# Patient Record
Sex: Female | Born: 2000 | Race: White | Hispanic: No | Marital: Single | Smoking: Never smoker
Health system: Southern US, Community
[De-identification: ages and names within clinical notes are randomized; demographics above are authoritative.]

## PROBLEM LIST (undated history)

## (undated) DIAGNOSIS — Z789 Other specified health status: Secondary | ICD-10-CM

## (undated) DIAGNOSIS — Z87440 Personal history of urinary (tract) infections: Secondary | ICD-10-CM

## (undated) DIAGNOSIS — D649 Anemia, unspecified: Secondary | ICD-10-CM

## (undated) HISTORY — DX: Other specified health status: Z78.9

## (undated) HISTORY — DX: Personal history of urinary (tract) infections: Z87.440

## (undated) HISTORY — DX: Anemia, unspecified: D64.9

## (undated) HISTORY — PX: NO PAST SURGERIES: SHX2092

---
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
## 2020-01-07 ENCOUNTER — Ambulatory Visit (INDEPENDENT_AMBULATORY_CARE_PROVIDER_SITE_OTHER): Payer: Medicaid Other | Admitting: Certified Nurse Midwife

## 2020-01-07 ENCOUNTER — Ambulatory Visit (INDEPENDENT_AMBULATORY_CARE_PROVIDER_SITE_OTHER): Payer: Medicaid Other

## 2020-01-07 ENCOUNTER — Other Ambulatory Visit: Payer: Self-pay

## 2020-01-07 VITALS — BP 102/50 | Wt 141.0 lb

## 2020-01-07 DIAGNOSIS — Z3480 Encounter for supervision of other normal pregnancy, unspecified trimester: Secondary | ICD-10-CM

## 2020-01-07 DIAGNOSIS — Z3A20 20 weeks gestation of pregnancy: Secondary | ICD-10-CM

## 2020-01-07 DIAGNOSIS — Z3482 Encounter for supervision of other normal pregnancy, second trimester: Secondary | ICD-10-CM

## 2020-01-07 NOTE — Progress Notes (Signed)
ROB and anatomy scan at [facility]: Feeling fetal movement. Skin on hands cracked irritated from using soap and sanitizer. Has tried some lotions without relief. No hx of eczema. Recommend use of Eucerin CGA 20wk3d, anterior placenta, female gender, normal anatomy, FHR 159  Desires to breast feed. Unsuccessful breast feeding with first baby. Given RSB information ROB in 4 weeks Farrel Conners, CNM

## 2020-01-07 NOTE — Progress Notes (Signed)
ROB/anatomy  C/o hands have been breaking out and very sensitive Denies lof, no vb, some flutters

## 2020-02-04 ENCOUNTER — Encounter: Payer: Medicaid Other | Admitting: Advanced Practice Midwife

## 2020-02-21 ENCOUNTER — Ambulatory Visit (INDEPENDENT_AMBULATORY_CARE_PROVIDER_SITE_OTHER): Payer: Medicaid Other | Admitting: Certified Nurse Midwife

## 2020-02-21 ENCOUNTER — Other Ambulatory Visit: Payer: Self-pay

## 2020-02-21 VITALS — BP 102/62 | Wt 156.0 lb

## 2020-02-21 DIAGNOSIS — Z131 Encounter for screening for diabetes mellitus: Secondary | ICD-10-CM

## 2020-02-21 DIAGNOSIS — Z3A26 26 weeks gestation of pregnancy: Secondary | ICD-10-CM

## 2020-02-21 DIAGNOSIS — Z130 Encounter for screening for diseases of the blood and blood-forming organs and certain disorders involving the immune mechanism: Secondary | ICD-10-CM

## 2020-02-21 DIAGNOSIS — Z3482 Encounter for supervision of other normal pregnancy, second trimester: Secondary | ICD-10-CM

## 2020-02-21 DIAGNOSIS — Z113 Encounter for screening for infections with a predominantly sexual mode of transmission: Secondary | ICD-10-CM

## 2020-02-21 LAB — POCT URINALYSIS DIPSTICK OB
Glucose, UA: NEGATIVE
POC,PROTEIN,UA: NEGATIVE

## 2020-02-23 NOTE — Progress Notes (Signed)
ROB at 26wk6d: Doing well. Baby active.No vaginal bleeding, contractions. Exam: FH 29cm FHT 135 O POS A: IUP at 26wk6d P: ROB and 28 weeks labs in 2 weeks.  Farrel Conners, CNM

## 2020-03-06 ENCOUNTER — Ambulatory Visit (INDEPENDENT_AMBULATORY_CARE_PROVIDER_SITE_OTHER): Payer: Medicaid Other | Admitting: Advanced Practice Midwife

## 2020-03-06 ENCOUNTER — Other Ambulatory Visit: Payer: Medicaid Other

## 2020-03-06 ENCOUNTER — Other Ambulatory Visit: Payer: Self-pay

## 2020-03-06 ENCOUNTER — Encounter: Payer: Self-pay | Admitting: Advanced Practice Midwife

## 2020-03-06 VITALS — BP 110/60 | Wt 163.0 lb

## 2020-03-06 DIAGNOSIS — Z113 Encounter for screening for infections with a predominantly sexual mode of transmission: Secondary | ICD-10-CM

## 2020-03-06 DIAGNOSIS — Z130 Encounter for screening for diseases of the blood and blood-forming organs and certain disorders involving the immune mechanism: Secondary | ICD-10-CM

## 2020-03-06 DIAGNOSIS — Z131 Encounter for screening for diabetes mellitus: Secondary | ICD-10-CM

## 2020-03-06 DIAGNOSIS — Z3A28 28 weeks gestation of pregnancy: Secondary | ICD-10-CM

## 2020-03-06 DIAGNOSIS — Z3483 Encounter for supervision of other normal pregnancy, third trimester: Secondary | ICD-10-CM

## 2020-03-06 LAB — POCT URINALYSIS DIPSTICK OB
Glucose, UA: NEGATIVE
POC,PROTEIN,UA: NEGATIVE

## 2020-03-06 NOTE — Patient Instructions (Signed)
Third Trimester of Pregnancy The third trimester is from week 28 through week 40 (months 7 through 9). The third trimester is a time when the unborn baby (fetus) is growing rapidly. At the end of the ninth month, the fetus is about 20 inches in length and weighs 6-10 pounds. Body changes during your third trimester Your body will continue to go through many changes during pregnancy. The changes vary from woman to woman. During the third trimester:  Your weight will continue to increase. You can expect to gain 25-35 pounds (11-16 kg) by the end of the pregnancy.  You may begin to get stretch marks on your hips, abdomen, and breasts.  You may urinate more often because the fetus is moving lower into your pelvis and pressing on your bladder.  You may develop or continue to have heartburn. This is caused by increased hormones that slow down muscles in the digestive tract.  You may develop or continue to have constipation because increased hormones slow digestion and cause the muscles that push waste through your intestines to relax.  You may develop hemorrhoids. These are swollen veins (varicose veins) in the rectum that can itch or be painful.  You may develop swollen, bulging veins (varicose veins) in your legs.  You may have increased body aches in the pelvis, back, or thighs. This is due to weight gain and increased hormones that are relaxing your joints.  You may have changes in your hair. These can include thickening of your hair, rapid growth, and changes in texture. Some women also have hair loss during or after pregnancy, or hair that feels dry or thin. Your hair will most likely return to normal after your baby is born.  Your breasts will continue to grow and they will continue to become tender. A yellow fluid (colostrum) may leak from your breasts. This is the first milk you are producing for your baby.  Your belly button may stick out.  You may notice more swelling in your hands,  face, or ankles.  You may have increased tingling or numbness in your hands, arms, and legs. The skin on your belly may also feel numb.  You may feel short of breath because of your expanding uterus.  You may have more problems sleeping. This can be caused by the size of your belly, increased need to urinate, and an increase in your body's metabolism.  You may notice the fetus "dropping," or moving lower in your abdomen (lightening).  You may have increased vaginal discharge.  You may notice your joints feel loose and you may have pain around your pelvic bone. What to expect at prenatal visits You will have prenatal exams every 2 weeks until week 36. Then you will have weekly prenatal exams. During a routine prenatal visit:  You will be weighed to make sure you and the baby are growing normally.  Your blood pressure will be taken.  Your abdomen will be measured to track your baby's growth.  The fetal heartbeat will be listened to.  Any test results from the previous visit will be discussed.  You may have a cervical check near your due date to see if your cervix has softened or thinned (effaced).  You will be tested for Group B streptococcus. This happens between 35 and 37 weeks. Your health care provider may ask you:  What your birth plan is.  How you are feeling.  If you are feeling the baby move.  If you have had any abnormal   symptoms, such as leaking fluid, bleeding, severe headaches, or abdominal cramping.  If you are using any tobacco products, including cigarettes, chewing tobacco, and electronic cigarettes.  If you have any questions. Other tests or screenings that may be performed during your third trimester include:  Blood tests that check for low iron levels (anemia).  Fetal testing to check the health, activity level, and growth of the fetus. Testing is done if you have certain medical conditions or if there are problems during the pregnancy.  Nonstress test  (NST). This test checks the health of your baby to make sure there are no signs of problems, such as the baby not getting enough oxygen. During this test, a belt is placed around your belly. The baby is made to move, and its heart rate is monitored during movement. What is false labor? False labor is a condition in which you feel small, irregular tightenings of the muscles in the womb (contractions) that usually go away with rest, changing position, or drinking water. These are called Braxton Hicks contractions. Contractions may last for hours, days, or even weeks before true labor sets in. If contractions come at regular intervals, become more frequent, increase in intensity, or become painful, you should see your health care provider. What are the signs of labor?  Abdominal cramps.  Regular contractions that start at 10 minutes apart and become stronger and more frequent with time.  Contractions that start on the top of the uterus and spread down to the lower abdomen and back.  Increased pelvic pressure and dull back pain.  A watery or bloody mucus discharge that comes from the vagina.  Leaking of amniotic fluid. This is also known as your "water breaking." It could be a slow trickle or a gush. Let your health care provider know if it has a color or strange odor. If you have any of these signs, call your health care provider right away, even if it is before your due date. Follow these instructions at home: Medicines  Follow your health care provider's instructions regarding medicine use. Specific medicines may be either safe or unsafe to take during pregnancy.  Take a prenatal vitamin that contains at least 600 micrograms (mcg) of folic acid.  If you develop constipation, try taking a stool softener if your health care provider approves. Eating and drinking   Eat a balanced diet that includes fresh fruits and vegetables, whole grains, good sources of protein such as meat, eggs, or tofu,  and low-fat dairy. Your health care provider will help you determine the amount of weight gain that is right for you.  Avoid raw meat and uncooked cheese. These carry germs that can cause birth defects in the baby.  If you have low calcium intake from food, talk to your health care provider about whether you should take a daily calcium supplement.  Eat four or five small meals rather than three large meals a day.  Limit foods that are high in fat and processed sugars, such as fried and sweet foods.  To prevent constipation: ? Drink enough fluid to keep your urine clear or pale yellow. ? Eat foods that are high in fiber, such as fresh fruits and vegetables, whole grains, and beans. Activity  Exercise only as directed by your health care provider. Most women can continue their usual exercise routine during pregnancy. Try to exercise for 30 minutes at least 5 days a week. Stop exercising if you experience uterine contractions.  Avoid heavy lifting.  Do   not exercise in extreme heat or humidity, or at high altitudes.  Wear low-heel, comfortable shoes.  Practice good posture.  You may continue to have sex unless your health care provider tells you otherwise. Relieving pain and discomfort  Take frequent breaks and rest with your legs elevated if you have leg cramps or low back pain.  Take warm sitz baths to soothe any pain or discomfort caused by hemorrhoids. Use hemorrhoid cream if your health care provider approves.  Wear a good support bra to prevent discomfort from breast tenderness.  If you develop varicose veins: ? Wear support pantyhose or compression stockings as told by your healthcare provider. ? Elevate your feet for 15 minutes, 3-4 times a day. Prenatal care  Write down your questions. Take them to your prenatal visits.  Keep all your prenatal visits as told by your health care provider. This is important. Safety  Wear your seat belt at all times when driving.  Make  a list of emergency phone numbers, including numbers for family, friends, the hospital, and police and fire departments. General instructions  Avoid cat litter boxes and soil used by cats. These carry germs that can cause birth defects in the baby. If you have a cat, ask someone to clean the litter box for you.  Do not travel far distances unless it is absolutely necessary and only with the approval of your health care provider.  Do not use hot tubs, steam rooms, or saunas.  Do not drink alcohol.  Do not use any products that contain nicotine or tobacco, such as cigarettes and e-cigarettes. If you need help quitting, ask your health care provider.  Do not use any medicinal herbs or unprescribed drugs. These chemicals affect the formation and growth of the baby.  Do not douche or use tampons or scented sanitary pads.  Do not cross your legs for long periods of time.  To prepare for the arrival of your baby: ? Take prenatal classes to understand, practice, and ask questions about labor and delivery. ? Make a trial run to the hospital. ? Visit the hospital and tour the maternity area. ? Arrange for maternity or paternity leave through employers. ? Arrange for family and friends to take care of pets while you are in the hospital. ? Purchase a rear-facing car seat and make sure you know how to install it in your car. ? Pack your hospital bag. ? Prepare the baby's nursery. Make sure to remove all pillows and stuffed animals from the baby's crib to prevent suffocation.  Visit your dentist if you have not gone during your pregnancy. Use a soft toothbrush to brush your teeth and be gentle when you floss. Contact a health care provider if:  You are unsure if you are in labor or if your water has broken.  You become dizzy.  You have mild pelvic cramps, pelvic pressure, or nagging pain in your abdominal area.  You have lower back pain.  You have persistent nausea, vomiting, or  diarrhea.  You have an unusual or bad smelling vaginal discharge.  You have pain when you urinate. Get help right away if:  Your water breaks before 37 weeks.  You have regular contractions less than 5 minutes apart before 37 weeks.  You have a fever.  You are leaking fluid from your vagina.  You have spotting or bleeding from your vagina.  You have severe abdominal pain or cramping.  You have rapid weight loss or weight gain.  You have   shortness of breath with chest pain.  You notice sudden or extreme swelling of your face, hands, ankles, feet, or legs.  Your baby makes fewer than 10 movements in 2 hours.  You have severe headaches that do not go away when you take medicine.  You have vision changes. Summary  The third trimester is from week 28 through week 40, months 7 through 9. The third trimester is a time when the unborn baby (fetus) is growing rapidly.  During the third trimester, your discomfort may increase as you and your baby continue to gain weight. You may have abdominal, leg, and back pain, sleeping problems, and an increased need to urinate.  During the third trimester your breasts will keep growing and they will continue to become tender. A yellow fluid (colostrum) may leak from your breasts. This is the first milk you are producing for your baby.  False labor is a condition in which you feel small, irregular tightenings of the muscles in the womb (contractions) that eventually go away. These are called Braxton Hicks contractions. Contractions may last for hours, days, or even weeks before true labor sets in.  Signs of labor can include: abdominal cramps; regular contractions that start at 10 minutes apart and become stronger and more frequent with time; watery or bloody mucus discharge that comes from the vagina; increased pelvic pressure and dull back pain; and leaking of amniotic fluid. This information is not intended to replace advice given to you by your  health care provider. Make sure you discuss any questions you have with your health care provider. Document Revised: 01/31/2019 Document Reviewed: 11/15/2016 Elsevier Patient Education  2020 [employer]  

## 2020-03-06 NOTE — Progress Notes (Signed)
Routine Prenatal Care Visit  Subjective  Denise Hardin is a 19 y.o. G2P1001 at [redacted]w[redacted]d being seen today for ongoing prenatal care.  She is currently monitored for the following issues for this low-risk pregnancy and has Migraine; Supervision of other normal pregnancy, antepartum; Vaginal bleeding in pregnancy, first trimester; and Nausea/vomiting in pregnancy on their problem list.  ----------------------------------------------------------------------------------- Patient reports no complaints.   Contractions: Not present. Vag. Bleeding: None.  Movement: Present. Leaking Fluid denies.  ----------------------------------------------------------------------------------- The following portions of the patient's history were reviewed and updated as appropriate: allergies, current medications, past family history, past medical history, past social history, past surgical history and problem list. Problem list updated.  Objective  Blood pressure 110/60, weight 163 lb (73.9 kg), last menstrual period 08/17/2019 Pregravid weight 133 lb (60.3 kg) Total Weight Gain 30 lb (13.6 kg) Urinalysis: Urine Protein Negative  Urine Glucose Negative  Fetal Status: Fetal Heart Rate (bpm): 136 Fundal Height: 29 cm Movement: Present     General:  Alert, oriented and cooperative. Patient is in no acute distress.  Skin: Skin is warm and dry. No rash noted.   Cardiovascular: Normal heart rate noted  Respiratory: Normal respiratory effort, no problems with respiration noted  Abdomen: Soft, gravid, appropriate for gestational age. Pain/Pressure: Absent     Pelvic:  Cervical exam deferred        Extremities: Normal range of motion.     Mental Status: Normal mood and affect. Normal behavior. Normal judgment and thought content.   Assessment   19 y.o. G2P1001 at [redacted]w[redacted]d by  05/23/2020, by Last Menstrual Period presenting for routine prenatal visit  Plan   Pregnancy #2 Problems (from 08/17/19 to present)    Problem  Noted Resolved   Supervision of other normal pregnancy, antepartum 10/28/2019 by Conard Novak, MD No   Overview Addendum 01/07/2020  8:42 PM by Farrel Conners, CNM    Clinic [facility] Prenatal Labs  Dating LMP Blood type: O/Positive/-- (12/17 1501)   Genetic Screen 1 Screen:    AFP:     Quad:     NIPS: Antibody:Negative (12/17 1501)  Anatomic [place] Normal anatomy, anterior placenta, female gender Rubella: 1.02 (12/17 1501)  Varicella: Immune  GTT Early:                Third trimester:  RPR: Non Reactive (12/17 1501)   Rhogam  HBsAg: Negative (12/17 1501)   Vaccines TDAP:                       Flu Shot: HIV: Non Reactive (12/17 1501)   Baby Food                                GBS:   Contraception  Pap:  CBB     CS/VBAC    Support Person            Vaginal bleeding in pregnancy, first trimester 10/28/2019 by Conard Novak, MD No   Nausea/vomiting in pregnancy 10/28/2019 by Conard Novak, MD No    28 week labs today   Preterm labor symptoms and general obstetric precautions including but not limited to vaginal bleeding, contractions, leaking of fluid and fetal movement were reviewed in detail with the patient. Please refer to After Visit Summary for other counseling recommendations.   Return in about 2 weeks (around 03/20/2020) for rob.  Tresea Mall, CNM 03/06/2020 11:23 AM

## 2020-03-10 LAB — 28 WEEK RH+PANEL
Basophils Absolute: 0 10*3/uL (ref 0.0–0.2)
Basos: 0 %
EOS (ABSOLUTE): 0.1 10*3/uL (ref 0.0–0.4)
Eos: 1 %
Gestational Diabetes Screen: 100 mg/dL (ref 65–139)
HIV Screen 4th Generation wRfx: NONREACTIVE
Hematocrit: 33.1 % — ABNORMAL LOW (ref 34.0–46.6)
Hemoglobin: 10.9 g/dL — ABNORMAL LOW (ref 11.1–15.9)
Immature Grans (Abs): 0.2 10*3/uL — ABNORMAL HIGH (ref 0.0–0.1)
Immature Granulocytes: 2 %
Lymphocytes Absolute: 2 10*3/uL (ref 0.7–3.1)
Lymphs: 20 %
MCH: 29.3 pg (ref 26.6–33.0)
MCHC: 32.9 g/dL (ref 31.5–35.7)
MCV: 89 fL (ref 79–97)
Monocytes Absolute: 0.7 10*3/uL (ref 0.1–0.9)
Monocytes: 7 %
Neutrophils Absolute: 7.1 10*3/uL — ABNORMAL HIGH (ref 1.4–7.0)
Neutrophils: 70 %
Platelets: 248 10*3/uL (ref 150–450)
RBC: 3.72 x10E6/uL — ABNORMAL LOW (ref 3.77–5.28)
RDW: 11.4 % — ABNORMAL LOW (ref 11.7–15.4)
RPR Ser Ql: NONREACTIVE
WBC: 10.2 10*3/uL (ref 3.4–10.8)

## 2020-03-10 LAB — HEPATITIS PANEL, ACUTE
Hep A IgM: NEGATIVE
Hep B C IgM: NEGATIVE
Hep C Virus Ab: <0.1 s/co ratio (ref 0.0–0.9)
Hepatitis B Surface Ag: NEGATIVE

## 2020-03-20 ENCOUNTER — Ambulatory Visit (INDEPENDENT_AMBULATORY_CARE_PROVIDER_SITE_OTHER): Payer: Medicaid Other | Admitting: Advanced Practice Midwife

## 2020-03-20 ENCOUNTER — Encounter: Payer: Self-pay | Admitting: Advanced Practice Midwife

## 2020-03-20 ENCOUNTER — Other Ambulatory Visit: Payer: Self-pay

## 2020-03-20 VITALS — BP 90/64 | Wt 166.0 lb

## 2020-03-20 DIAGNOSIS — Z3A30 30 weeks gestation of pregnancy: Secondary | ICD-10-CM

## 2020-03-20 DIAGNOSIS — Z3483 Encounter for supervision of other normal pregnancy, third trimester: Secondary | ICD-10-CM

## 2020-03-20 LAB — POCT URINALYSIS DIPSTICK OB
Glucose, UA: NEGATIVE
POC,PROTEIN,UA: NEGATIVE

## 2020-03-20 NOTE — Patient Instructions (Signed)

## 2020-03-20 NOTE — Progress Notes (Signed)
Routine Prenatal Care Visit  Subjective  Denise Hardin is a 19 y.o. G2P1001 at [redacted]w[redacted]d being seen today for ongoing prenatal care.  She is currently monitored for the following issues for this low-risk pregnancy and has Migraine; Supervision of other normal pregnancy, antepartum; Vaginal bleeding in pregnancy, first trimester; and Nausea/vomiting in pregnancy on their problem list.  ----------------------------------------------------------------------------------- Patient reports no complaints.  We discussed TDAP- she will go to health department for vaccine. Contractions: Not present. Vag. Bleeding: None.  Movement: Present. Leaking Fluid denies.  ----------------------------------------------------------------------------------- The following portions of the patient's history were reviewed and updated as appropriate: allergies, current medications, past family history, past medical history, past social history, past surgical history and problem list. Problem list updated.  Objective  Blood pressure 90/64, weight 166 lb (75.3 kg), last menstrual period 08/17/2019, not currently breastfeeding. Pregravid weight 133 lb (60.3 kg) Total Weight Gain 33 lb (15 kg) Urinalysis: Urine Protein    Urine Glucose    Fetal Status: Fetal Heart Rate (bpm): 131 Fundal Height: 31 cm Movement: Present     General:  Alert, oriented and cooperative. Patient is in no acute distress.  Skin: Skin is warm and dry. No rash noted.   Cardiovascular: Normal heart rate noted  Respiratory: Normal respiratory effort, no problems with respiration noted  Abdomen: Soft, gravid, appropriate for gestational age. Pain/Pressure: Absent     Pelvic:  Cervical exam deferred        Extremities: Normal range of motion.  Edema: None  Mental Status: Normal mood and affect. Normal behavior. Normal judgment and thought content.   Assessment   19 y.o. G2P1001 at [redacted]w[redacted]d by  05/23/2020, by Last Menstrual Period presenting for routine  prenatal visit  Plan   Pregnancy #2 Problems (from 08/17/19 to present)    Problem Noted Resolved   Supervision of other normal pregnancy, antepartum 10/28/2019 by Will Bonnet, MD No   Overview Addendum 03/06/2020 11:25 AM by Rod Can, [place] Prenatal Labs  Dating LMP Blood type: O/Positive/-- (12/17 1501)   Genetic Screen 1 Screen:    AFP:     Quad:     NIPS: Antibody:Negative (12/17 1501)  Anatomic [place] Normal anatomy, anterior placenta, female gender Rubella: 1.02 (12/17 1501)  Varicella: Immune  GTT Early: NA               Third trimester:  RPR: Non Reactive (12/17 1501)   Rhogam  HBsAg: Negative (12/17 1501)   Vaccines TDAP:                       Flu Shot: HIV: Non Reactive (12/17 1501)   Baby Food                                GBS:   Contraception  Pap:  CBB     CS/VBAC    Support Person            Vaginal bleeding in pregnancy, first trimester 10/28/2019 by Will Bonnet, MD No   Nausea/vomiting in pregnancy 10/28/2019 by Will Bonnet, MD No       Preterm labor symptoms and general obstetric precautions including but not limited to vaginal bleeding, contractions, leaking of fluid and fetal movement were reviewed in detail with the patient. Please refer to After Visit Summary for other counseling recommendations.   Return in about 2 weeks (around  04/03/2020) for rob.  Tresea Mall, CNM 03/20/2020 1:55 PM

## 2020-03-20 NOTE — Progress Notes (Signed)
ROB/BT consent signed today(TDAP at [facility] <21)- no concerns

## 2020-03-20 NOTE — Addendum Note (Signed)
Addended by: Donnetta Hail on: 03/20/2020 02:00 PM   Modules accepted: Orders

## 2020-04-03 ENCOUNTER — Encounter: Payer: Self-pay | Admitting: Obstetrics & Gynecology

## 2020-04-03 ENCOUNTER — Ambulatory Visit (INDEPENDENT_AMBULATORY_CARE_PROVIDER_SITE_OTHER): Payer: Medicaid Other | Admitting: Obstetrics & Gynecology

## 2020-04-03 ENCOUNTER — Other Ambulatory Visit: Payer: Self-pay

## 2020-04-03 VITALS — BP 110/70 | Wt 168.0 lb

## 2020-04-03 DIAGNOSIS — Z3483 Encounter for supervision of other normal pregnancy, third trimester: Secondary | ICD-10-CM

## 2020-04-03 DIAGNOSIS — Z3A32 32 weeks gestation of pregnancy: Secondary | ICD-10-CM

## 2020-04-03 MED ORDER — FAMOTIDINE 20 MG PO TABS: 20.0000 mg | ORAL_TABLET | Freq: Two times a day (BID) | ORAL | 3 refills | Status: DC | PRN

## 2020-04-03 NOTE — Patient Instructions (Signed)
Braxton Hicks Contractions °Contractions of the uterus can occur throughout pregnancy, but they are not always a sign that you are in labor. You may have practice contractions called Braxton Hicks contractions. These false labor contractions are sometimes confused with true labor. °What are Braxton Hicks contractions? °Braxton Hicks contractions are tightening movements that occur in the muscles of the uterus before labor. Unlike true labor contractions, these contractions do not result in opening (dilation) and thinning of the cervix. Toward the end of pregnancy (32-34 weeks), Braxton Hicks contractions can happen more often and may become stronger. These contractions are sometimes difficult to tell apart from true labor because they can be very uncomfortable. You should not feel embarrassed if you go to the hospital with false labor. °Sometimes, the only way to tell if you are in true labor is for your health care provider to look for changes in the cervix. The health care provider will do a physical exam and may monitor your contractions. If you are not in true labor, the exam should show that your cervix is not dilating and your water has not broken. °If there are no other health problems associated with your pregnancy, it is completely safe for you to be sent home with false labor. You may continue to have Braxton Hicks contractions until you go into true labor. °How to tell the difference between true labor and false labor °True labor °· Contractions last 30-70 seconds. °· Contractions become very regular. °· Discomfort is usually felt in the top of the uterus, and it spreads to the lower abdomen and low back. °· Contractions do not go away with walking. °· Contractions usually become more intense and increase in frequency. °· The cervix dilates and gets thinner. °False labor °· Contractions are usually shorter and not as strong as true labor contractions. °· Contractions are usually irregular. °· Contractions  are often felt in the front of the lower abdomen and in the groin. °· Contractions may go away when you walk around or change positions while lying down. °· Contractions get weaker and are shorter-lasting as time goes on. °· The cervix usually does not dilate or become thin. °Follow these instructions at home: ° °· Take over-the-counter and prescription medicines only as told by your health care provider. °· Keep up with your usual exercises and follow other instructions from your health care provider. °· Eat and drink lightly if you think you are going into labor. °· If Braxton Hicks contractions are making you uncomfortable: °? Change your position from lying down or resting to walking, or change from walking to resting. °? Sit and rest in a tub of warm water. °? Drink enough fluid to keep your urine pale yellow. Dehydration may cause these contractions. °? Do slow and deep breathing several times an hour. °· Keep all follow-up prenatal visits as told by your health care provider. This is important. °Contact a health care provider if: °· You have a fever. °· You have continuous pain in your abdomen. °Get help right away if: °· Your contractions become stronger, more regular, and closer together. °· You have fluid leaking or gushing from your vagina. °· You pass blood-tinged mucus (bloody show). °· You have bleeding from your vagina. °· You have low back pain that you never had before. °· You feel your baby’s head pushing down and causing pelvic pressure. °· Your baby is not moving inside you as much as it used to. °Summary °· Contractions that occur before labor are   called Braxton Hicks contractions, false labor, or practice contractions. °· Braxton Hicks contractions are usually shorter, weaker, farther apart, and less regular than true labor contractions. True labor contractions usually become progressively stronger and regular, and they become more frequent. °· Manage discomfort from Braxton Hicks contractions  by changing position, resting in a warm bath, drinking plenty of water, or practicing deep breathing. °This information is not intended to replace advice given to you by your health care provider. Make sure you discuss any questions you have with your health care provider. °Document Revised: 09/22/2017 Document Reviewed: 02/23/2017 °Elsevier Patient Education © 2020 [employer] ° °

## 2020-04-03 NOTE — Progress Notes (Signed)
  Subjective  Fetal Movement? yes Contractions? no Leaking Fluid? no Vaginal Bleeding? no GERD Objective  BP 110/70   Wt 168 lb (76.2 kg)   LMP 08/17/2019 (Exact Date) Comment: Nexplanon removed early 06/2019  BMI 30.73 kg/m  General: NAD Pumonary: no increased work of breathing Abdomen: gravid, non-tender Extremities: no edema Psychiatric: mood appropriate, affect full  Assessment  19 y.o. G2P1001 at [redacted]w[redacted]d by  05/23/2020, by Last Menstrual Period presenting for routine prenatal visit  Plan   Problem List Items Addressed This Visit    None    Visit Diagnoses    [redacted] weeks gestation of pregnancy    -  Primary   Encounter for supervision of other normal pregnancy in third trimester          Pregnancy #2 Problems (from 08/17/19 to present)    Problem Noted Resolved   Supervision of other normal pregnancy, antepartum 10/28/2019 by Conard Novak, MD No   Overview Addendum 03/06/2020 11:25 AM by Tresea Mall, CNM    [facility]inic [facility] Prenatal Labs  Dating LMP Blood type: O/Positive/-- (12/17 1501)   Genetic Screen 1 Screen:    AFP:     Quad:     NIPS: Antibody:Negative (12/17 1501)  Anatomic [place] Normal anatomy, anterior placenta, female gender Rubella: 1.02 (12/17 1501)  Varicella: Immune  GTT Early: NA               Third trimester:  RPR: Non Reactive (12/17 1501)   Rhogam  HBsAg: Negative (12/17 1501)   Vaccines TDAP:                       Flu Shot: HIV: Non Reactive (12/17 1501)   Baby Food               Breast                 GBS: p  Contraception               POP Pap:  CBB  no   CS/VBAC n/a   Support Person            Previous Version   Vaginal bleeding in pregnancy, first trimester 10/28/2019 by Conard Novak, MD No   Nausea/vomiting in pregnancy 10/28/2019 by Conard Novak, MD No       [name], MD, [facility], [facility] 04/03/2020  3:07 PM

## 2020-04-17 ENCOUNTER — Encounter: Payer: Medicaid Other | Admitting: Advanced Practice Midwife

## 2020-04-23 ENCOUNTER — Ambulatory Visit (INDEPENDENT_AMBULATORY_CARE_PROVIDER_SITE_OTHER): Payer: Medicaid Other | Admitting: Obstetrics

## 2020-04-23 ENCOUNTER — Other Ambulatory Visit: Payer: Self-pay

## 2020-04-23 VITALS — BP 100/60 | Wt 177.0 lb

## 2020-04-23 DIAGNOSIS — Z3480 Encounter for supervision of other normal pregnancy, unspecified trimester: Secondary | ICD-10-CM

## 2020-04-23 DIAGNOSIS — Z3A35 35 weeks gestation of pregnancy: Secondary | ICD-10-CM

## 2020-04-23 DIAGNOSIS — K219 Gastro-esophageal reflux disease without esophagitis: Secondary | ICD-10-CM

## 2020-04-23 DIAGNOSIS — Z3483 Encounter for supervision of other normal pregnancy, third trimester: Secondary | ICD-10-CM

## 2020-04-23 LAB — POCT URINALYSIS DIPSTICK OB
Glucose, UA: NEGATIVE
POC,PROTEIN,UA: NEGATIVE

## 2020-04-23 MED ORDER — FAMOTIDINE 20 MG PO TABS: 20.0000 mg | ORAL_TABLET | Freq: Two times a day (BID) | ORAL | 3 refills | Status: DC | PRN

## 2020-04-23 NOTE — Progress Notes (Signed)
IUP at 60w5days with request for more GERD medicine. Denies any danger sxs. Ample fetal movement. O: See VE       TWG 44 lbs. A:   IUP35weeks5days       S=D      Reflux P: Will renew her reflux medication.      GBS culture retrieved       RTC in 1 week for weekly visits. Mirna Mires, CNM  04/23/2020 3:54 PM

## 2020-04-23 NOTE — Addendum Note (Signed)
Addended by: Mirna Mires on: 04/23/2020 04:08 PM   Modules accepted: Orders

## 2020-04-23 NOTE — Progress Notes (Signed)
ROB/GBS- no concerns 

## 2020-04-27 LAB — CULTURE, BETA STREP (GROUP B ONLY): Strep Gp B Culture: NEGATIVE

## 2020-05-01 ENCOUNTER — Ambulatory Visit (INDEPENDENT_AMBULATORY_CARE_PROVIDER_SITE_OTHER): Payer: Medicaid Other | Admitting: Obstetrics

## 2020-05-01 ENCOUNTER — Other Ambulatory Visit: Payer: Self-pay

## 2020-05-01 VITALS — BP 116/74 | Wt 178.0 lb

## 2020-05-01 DIAGNOSIS — Z3A36 36 weeks gestation of pregnancy: Secondary | ICD-10-CM

## 2020-05-01 DIAGNOSIS — Z3480 Encounter for supervision of other normal pregnancy, unspecified trimester: Secondary | ICD-10-CM

## 2020-05-01 NOTE — Progress Notes (Signed)
ROB

## 2020-05-01 NOTE — Progress Notes (Signed)
Routine Prenatal Care Visit  Subjective  Denise Hardin is a 19 y.o. G2P1001 at [redacted]w[redacted]d being seen today for ongoing prenatal care.  She is currently monitored for the following issues for this low-risk pregnancy and has Migraine; Supervision of other normal pregnancy, antepartum; Vaginal bleeding in pregnancy, first trimester; and Nausea/vomiting in pregnancy on their problem list.  ----------------------------------------------------------------------------------- Patient reports an episode of LOF the other night, that did not repeat. she did not put on a pad. she denies contractions or any additional LOF today..   Contractions: Not present. Vag. Bleeding: None.  Movement: Present. Leaking Fluid denies today. See note above.  ----------------------------------------------------------------------------------- The following portions of the patient's history were reviewed and updated as appropriate: allergies, current medications, past family history, past medical history, past social history, past surgical history and problem list. Problem list updated.  Objective  Blood pressure 116/74, weight 178 lb (80.7 kg), last menstrual period 08/17/2019. Pregravid weight 133 lb (60.3 kg) Total Weight Gain 45 lb (20.4 kg) Urinalysis: Urine Protein    Urine Glucose    Fetal Status: Fetal Heart Rate (bpm): 135 Fundal Height: 36 cm Movement: Present  Presentation: Vertex  General:  Alert, oriented and cooperative. Patient is in no acute distress.  Skin: Skin is warm and dry. No rash noted.   Cardiovascular: Normal heart rate noted  Respiratory: Normal respiratory effort, no problems with respiration noted  Abdomen: Soft, gravid, appropriate for gestational age. Pain/Pressure: Absent     Pelvic:  Cervical exam performed Dilation: 2 Effacement (%): 60 Station: -3  Extremities: Normal range of motion.  Edema: None  Mental Status: Normal mood and affect. Normal behavior. Normal judgment and thought content.    Fern test is NEGATIVE. Neg pooling Assessment   19 y.o. G2P1001 at [redacted]w[redacted]d by  05/23/2020, by Last Menstrual Period presenting for routine prenatal visit IBOW per testing today.  Plan   Pregnancy #2 Problems (from 08/17/19 to present)    Problem Noted Resolved   Supervision of other normal pregnancy, antepartum 10/28/2019 by Conard Novak, MD No   Overview Addendum 05/01/2020  2:01 PM by Mirna Mires, CNM    [facility]inic [facility] Prenatal Labs  Dating LMP Blood type: O/Positive/-- (12/17 1501)   Genetic Screen 1 Screen:    AFP:     Quad:     NIPS: Antibody:Negative (12/17 1501)  Anatomic [place] Normal anatomy, anterior placenta, female gender Rubella: 1.02 (12/17 1501)  Varicella: Immune  GTT Early: NA               Third trimester:  RPR: Non Reactive (12/17 1501)   Rhogam  HBsAg: Negative (12/17 1501)   Vaccines TDAP:       Given at [facility]                Flu Shot: HIV: Non Reactive (12/17 1501)   Baby Food                                GBS: 7/1 GBS  Contraception  Pap:  CBB     CS/VBAC    Support Person            Previous Version   Vaginal bleeding in pregnancy, first trimester 10/28/2019 by Conard Novak, MD No   Nausea/vomiting in pregnancy 10/28/2019 by Conard Novak, MD No       Term labor symptoms and general obstetric precautions including but not limited  to vaginal bleeding, contractions, leaking of fluid and fetal movement were reviewed in detail with the patient. Please refer to After Visit Summary for other counseling recommendations.   Return in about 1 week (around 05/08/2020) for return OB.  Mirna Mires, CNM  05/01/2020 2:23 PM

## 2020-05-08 ENCOUNTER — Ambulatory Visit (INDEPENDENT_AMBULATORY_CARE_PROVIDER_SITE_OTHER): Payer: Medicaid Other | Admitting: Obstetrics

## 2020-05-08 ENCOUNTER — Other Ambulatory Visit: Payer: Self-pay

## 2020-05-08 VITALS — BP 108/70 | Wt 182.0 lb

## 2020-05-08 DIAGNOSIS — Z3A37 37 weeks gestation of pregnancy: Secondary | ICD-10-CM

## 2020-05-08 DIAGNOSIS — Z3483 Encounter for supervision of other normal pregnancy, third trimester: Secondary | ICD-10-CM

## 2020-05-08 DIAGNOSIS — Z3480 Encounter for supervision of other normal pregnancy, unspecified trimester: Secondary | ICD-10-CM

## 2020-05-08 NOTE — Progress Notes (Signed)
Routine Prenatal Care Visit  Subjective  Denise Hardin is a 19 y.o. G2P1001 at [redacted]w[redacted]d being seen today for ongoing prenatal care.  She is currently monitored for the following issues for this low-risk pregnancy and has Migraine; Supervision of other normal pregnancy, antepartum; Vaginal bleeding in pregnancy, first trimester; and Nausea/vomiting in pregnancy on their problem list.  ----------------------------------------------------------------------------------- Patient reports no complaints. She would like to be checked today.   .  .   Pincus Large Fluid denies.  ----------------------------------------------------------------------------------- The following portions of the patient's history were reviewed and updated as appropriate: allergies, current medications, past family history, past medical history, past social history, past surgical history and problem list. Problem list updated.  Objective  Blood pressure 108/70, weight 182 lb (82.6 kg), last menstrual period 08/17/2019. Pregravid weight 133 lb (60.3 kg) Total Weight Gain 49 lb (22.2 kg) Urinalysis: Urine Protein    Urine Glucose    Fetal Status:           General:  Alert, oriented and cooperative. Patient is in no acute distress.  Skin: Skin is warm and dry. No rash noted.   Cardiovascular: Normal heart rate noted  Respiratory: Normal respiratory effort, no problems with respiration noted  Abdomen: Soft, gravid, appropriate for gestational age.       Pelvic:  Cervical exam performed        Extremities: Normal range of motion.     Mental Status: Normal mood and affect. Normal behavior. Normal judgment and thought content.   Assessment   19 y.o. G2P1001 at [redacted]w[redacted]d by  05/23/2020, by Last Menstrual Period presenting for routine prenatal visit  Plan   Pregnancy #2 Problems (from 08/17/19 to present)    Problem Noted Resolved   Supervision of other normal pregnancy, antepartum 10/28/2019 by Conard Novak, MD No   Overview  Addendum 05/08/2020  4:50 PM by Mirna Mires, CNM    [facility] Prenatal Labs  Dating LMP Blood type: O/Positive/-- (12/17 1501)   Genetic Screen 1 Screen:    AFP:     Quad:     NIPS: Antibody:Negative (12/17 1501)  Anatomic [place] Normal anatomy, anterior placenta, female gender Rubella: 1.02 (12/17 1501)  Varicella: Immune  GTT Early: NA               Third trimester:  RPR: Non Reactive (12/17 1501)   Rhogam  HBsAg: Negative (12/17 1501)   Vaccines TDAP:       Given at [facility]                Flu Shot: HIV: Non Reactive (12/17 1501)   Baby Food                                GBS: 7/1 negative  Contraception  Pap:  CBB     CS/VBAC    Support Person            Previous Version   Vaginal bleeding in pregnancy, first trimester 10/28/2019 by Conard Novak, MD No   Nausea/vomiting in pregnancy 10/28/2019 by Conard Novak, MD No       Term labor symptoms and general obstetric precautions including but not limited to vaginal bleeding, contractions, leaking of fluid and fetal movement were reviewed in detail with the patient. Please refer to After Visit Summary for other counseling recommendations.   Return in about 2 weeks (around 05/22/2020) for return OB.  Mirna Mires, CNM  05/08/2020 5:05 PM

## 2020-05-08 NOTE — Progress Notes (Signed)
ROB- cervix check 

## 2020-05-15 ENCOUNTER — Encounter: Payer: Self-pay | Admitting: Advanced Practice Midwife

## 2020-05-15 ENCOUNTER — Other Ambulatory Visit: Payer: Self-pay

## 2020-05-15 ENCOUNTER — Ambulatory Visit (INDEPENDENT_AMBULATORY_CARE_PROVIDER_SITE_OTHER): Payer: Medicaid Other | Admitting: Advanced Practice Midwife

## 2020-05-15 VITALS — BP 112/72 | Ht 62.0 in | Wt 183.0 lb

## 2020-05-15 DIAGNOSIS — Z3480 Encounter for supervision of other normal pregnancy, unspecified trimester: Secondary | ICD-10-CM

## 2020-05-15 DIAGNOSIS — Z3483 Encounter for supervision of other normal pregnancy, third trimester: Secondary | ICD-10-CM

## 2020-05-15 DIAGNOSIS — Z3A38 38 weeks gestation of pregnancy: Secondary | ICD-10-CM

## 2020-05-15 LAB — POCT URINALYSIS DIPSTICK OB
Glucose, UA: NEGATIVE
POC,PROTEIN,UA: NEGATIVE

## 2020-05-15 NOTE — Progress Notes (Signed)
Routine Prenatal Care Visit  Subjective  Denise Hardin is a 20 y.o. G2P1001 at [redacted]w[redacted]d being seen today for ongoing prenatal care.  She is currently monitored for the following issues for this low-risk pregnancy and has Migraine; Supervision of other normal pregnancy, antepartum; Vaginal bleeding in pregnancy, first trimester; and Nausea/vomiting in pregnancy on their problem list.  ----------------------------------------------------------------------------------- Patient reports no complaints.   Contractions: Not present. Vag. Bleeding: None.  Movement: Present. Leaking Fluid denies.  ----------------------------------------------------------------------------------- The following portions of the patient's history were reviewed and updated as appropriate: allergies, current medications, past family history, past medical history, past social history, past surgical history and problem list. Problem list updated.  Objective  Blood pressure 112/72, height 5\' 2"  (1.575 m), weight 183 lb (83 kg), last menstrual period 08/17/2019. Pregravid weight 133 lb (60.3 kg) Total Weight Gain 50 lb (22.7 kg) Urinalysis: Urine Protein Negative  Urine Glucose Negative  Fetal Status: Fetal Heart Rate (bpm): 139 Fundal Height: 38 cm Movement: Present     General:  Alert, oriented and cooperative. Patient is in no acute distress.  Skin: Skin is warm and dry. No rash noted.   Cardiovascular: Normal heart rate noted  Respiratory: Normal respiratory effort, no problems with respiration noted  Abdomen: Soft, gravid, appropriate for gestational age. Pain/Pressure: Absent     Pelvic:  Cervical exam deferred        Extremities: Normal range of motion.  Edema: None  Mental Status: Normal mood and affect. Normal behavior. Normal judgment and thought content.   Assessment   19 y.o. G2P1001 at [redacted]w[redacted]d by  05/23/2020, by Last Menstrual Period presenting for routine prenatal visit  Plan   Pregnancy #2 Problems (from  08/17/19 to present)    Problem Noted Resolved   Supervision of other normal pregnancy, antepartum 10/28/2019 by 12/26/2019, MD No   Overview Addendum 05/08/2020  5:08 PM by 05/10/2020, CNM    [facility] Prenatal Labs  Dating LMP Blood type: O/Positive/-- (12/17 1501)   Genetic Screen 1 Screen:    AFP:     Quad:     NIPS: Antibody:Negative (12/17 1501)  Anatomic 10-15-1982 Normal anatomy, anterior placenta, female gender Rubella: 1.02 (12/17 1501)  Varicella: Immune  GTT Early: NA               Third trimester:  RPR: Non Reactive (12/17 1501)   Rhogam  HBsAg: Negative (12/17 1501)   Vaccines TDAP:       Given at [facility]                Flu Shot: HIV: Non Reactive (12/17 1501)   Baby Food                Breast                GBS: 7/1 negative  Contraception  Pap:  CBB     CS/VBAC    Support Person  FOB           Previous Version   Vaginal bleeding in pregnancy, first trimester 10/28/2019 by 12/26/2019, MD No   Nausea/vomiting in pregnancy 10/28/2019 by 12/26/2019, MD No       Term labor symptoms and general obstetric precautions including but not limited to vaginal bleeding, contractions, leaking of fluid and fetal movement were reviewed in detail with the patient.   Return in about 1 week (around 05/22/2020) for rob.  05/24/2020, CNM 05/15/2020 4:59 PM

## 2020-05-22 ENCOUNTER — Ambulatory Visit (INDEPENDENT_AMBULATORY_CARE_PROVIDER_SITE_OTHER): Payer: Medicaid Other | Admitting: Certified Nurse Midwife

## 2020-05-22 ENCOUNTER — Other Ambulatory Visit: Payer: Self-pay

## 2020-05-22 VITALS — BP 108/60 | Wt 183.0 lb

## 2020-05-22 DIAGNOSIS — Z3A39 39 weeks gestation of pregnancy: Secondary | ICD-10-CM

## 2020-05-22 DIAGNOSIS — Z3483 Encounter for supervision of other normal pregnancy, third trimester: Secondary | ICD-10-CM

## 2020-05-22 DIAGNOSIS — Z3480 Encounter for supervision of other normal pregnancy, unspecified trimester: Secondary | ICD-10-CM

## 2020-05-22 LAB — POCT URINALYSIS DIPSTICK OB
Glucose, UA: NEGATIVE
POC,PROTEIN,UA: NEGATIVE

## 2020-05-22 NOTE — Progress Notes (Signed)
No concerns.rj 

## 2020-05-24 NOTE — Progress Notes (Signed)
ROB at 39wk6d: Doing well. Denies regular contractions, leakage of fluid or vaginal bleeding. Baby moving well.  Exam: FHTs WNL. BP 108/60. Negative proteinuria. TWG at 50# (weight 183#) Cervix:   A: IUP at 39wk6d  P: [place] precautions IOL scheduled for 41.1 weeks (8 August) Covid testing 6 August  Farrel Conners, [place]

## 2020-05-29 ENCOUNTER — Encounter: Payer: Medicaid Other | Admitting: Obstetrics & Gynecology

## 2020-07-06 ENCOUNTER — Ambulatory Visit: Payer: Medicaid Other | Admitting: Obstetrics and Gynecology

## 2020-07-31 ENCOUNTER — Ambulatory Visit: Payer: Medicaid Other | Admitting: Obstetrics and Gynecology

## 2020-08-15 ENCOUNTER — Other Ambulatory Visit: Payer: Self-pay

## 2020-08-15 ENCOUNTER — Encounter (HOSPITAL_COMMUNITY): Payer: Self-pay | Admitting: Emergency Medicine

## 2020-08-15 ENCOUNTER — Emergency Department (HOSPITAL_COMMUNITY)
Admission: EM | Admit: 2020-08-15 | Discharge: 2020-08-15 | Disposition: A | Discharge status: Home / Self Care | Payer: Medicaid Other | Attending: Emergency Medicine | Admitting: Emergency Medicine

## 2020-08-15 DIAGNOSIS — R109 Unspecified abdominal pain: Secondary | ICD-10-CM

## 2020-08-15 DIAGNOSIS — R42 Dizziness and giddiness: Secondary | ICD-10-CM | POA: Diagnosis not present

## 2020-08-15 LAB — URINALYSIS, ROUTINE W REFLEX MICROSCOPIC
Bilirubin Urine: NEGATIVE
Glucose, UA: NEGATIVE mg/dL
Hgb urine dipstick: NEGATIVE
Ketones, ur: NEGATIVE mg/dL
Nitrite: NEGATIVE
Protein, ur: NEGATIVE mg/dL
Specific Gravity, Urine: 1.024 (ref 1.005–1.030)
pH: 8 (ref 5.0–8.0)

## 2020-08-15 LAB — CBC
HCT: 40.4 % (ref 36.0–46.0)
Hemoglobin: 12.7 g/dL (ref 12.0–15.0)
MCH: 25.8 pg — ABNORMAL LOW (ref 26.0–34.0)
MCHC: 31.4 g/dL (ref 30.0–36.0)
MCV: 82.1 fL (ref 80.0–100.0)
Platelets: 318 10*3/uL (ref 150–400)
RBC: 4.92 MIL/uL (ref 3.87–5.11)
RDW: 13.9 % (ref 11.5–15.5)
WBC: 9.1 10*3/uL (ref 4.0–10.5)
nRBC: 0 % (ref 0.0–0.2)

## 2020-08-15 LAB — COMPREHENSIVE METABOLIC PANEL
ALT: 18 U/L (ref 0–44)
AST: 21 U/L (ref 15–41)
Albumin: 4.7 g/dL (ref 3.5–5.0)
Alkaline Phosphatase: 76 U/L (ref 38–126)
Anion gap: 9 (ref 5–15)
BUN: 17 mg/dL (ref 6–20)
CO2: 26 mmol/L (ref 22–32)
Calcium: 9.3 mg/dL (ref 8.9–10.3)
Chloride: 105 mmol/L (ref 98–111)
Creatinine, Ser: 0.9 mg/dL (ref 0.44–1.00)
GFR, Estimated: >60 mL/min (ref >60)
Glucose, Bld: 127 mg/dL — ABNORMAL HIGH (ref 70–99)
Potassium: 3.5 mmol/L (ref 3.5–5.1)
Sodium: 140 mmol/L (ref 135–145)
Total Bilirubin: 0.9 mg/dL (ref 0.3–1.2)
Total Protein: 7.8 g/dL (ref 6.5–8.1)

## 2020-08-15 LAB — LIPASE, BLOOD: Lipase: 24 U/L (ref 11–51)

## 2020-08-15 LAB — HCG, QUANTITATIVE, PREGNANCY: hCG, Beta Chain, Quant, S: <1 m[IU]/mL (ref <5)

## 2020-08-15 MED ORDER — SODIUM CHLORIDE 0.9 % IV BOLUS
1000.0000 mL | Freq: Once | INTRAVENOUS | Status: AC
  Administered 2020-08-15: 1000 mL via INTRAVENOUS

## 2020-08-15 MED ORDER — KETOROLAC TROMETHAMINE 15 MG/ML IJ SOLN
15.0000 mg | Freq: Once | INTRAMUSCULAR | Status: AC
  Administered 2020-08-15: 15 mg via INTRAVENOUS
  Filled 2020-08-15: qty 1

## 2020-08-15 NOTE — ED Notes (Signed)
PT reports that she she "faints easily" and felt like she may faint pta.  VSS.  Will continue to monitor.

## 2020-08-15 NOTE — Discharge Instructions (Addendum)
Your evaluation in the emergency department today has been reassuring.  We recommend follow-up with your OB/GYN for postpartum care.  Take Tylenol or ibuprofen for management of persistent pain.  Use as instructed on the box/bottle.  Return for new or concerning symptoms.

## 2020-08-15 NOTE — ED Triage Notes (Addendum)
Patient states for the pass three days she has been feeling faint. Patient is having abdominal pain and her mouth is dry. Patient last menstrual cycle 07/11/2020.

## 2020-08-15 NOTE — ED Provider Notes (Addendum)
[facility]-EMERGENCY DEPT Provider Note   CSN: [id number] Arrival date & time: 08/15/20  0114     History Chief Complaint  Patient presents with  . Abdominal Pain    Denise Hardin is a 19 y.o. female.  19 year old female presents to the emergency department for evaluation of abdominal cramping.  She states that symptoms began tonight and have been waxing and waning in severity.  Pain is nonradiating without known modifying factors.  She has not taken any medications for her discomfort.  States that she began to feel lightheaded after her pain began.  Has been experiencing intermittent white spots in her vision and the sensation of tunnel vision.  She has not had any syncope since onset of her lightheadedness and abdominal pain.  Lightheadedness is improved by holding a wet rag.  She reports getting lightheaded on occasion quite frequently and this usually helps her symptoms subside.  Denies fever, sick contacts, nausea, vomiting, diarrhea, dysuria, hematuria, vaginal bleeding, vaginal discharge, melena, hematochezia.  LMP 07/11/20.  She reports being 3 months postpartum after delivery via C-section without complications.  Has not yet followed up with her OB/GYN for reassessment.  States that she has good social support at home with her infant including her husband, parents, and grandparents.  The history is provided by the patient. No language interpreter was used.  Abdominal Pain      Past Medical History:  Diagnosis Date  . Anemia   . History of urinary tract infection   . No known health problems     Patient Active Problem List   Diagnosis Date Noted  . Supervision of other normal pregnancy, antepartum 10/28/2019  . Vaginal bleeding in pregnancy, first trimester 10/28/2019  . Nausea/vomiting in pregnancy 10/28/2019  . Migraine 10/05/2017    Past Surgical History:  Procedure Laterality Date  . NO PAST SURGERIES       OB History    Gravida  2    Para  2   Term  2   Preterm      AB      Living  2     SAB      TAB      Ectopic      Multiple  0   Live Births  2           Family History  Problem Relation Age of Onset  . Diabetes Maternal Grandmother   . Ovarian cancer Paternal Grandmother     Social History   Tobacco Use  . Smoking status: Never Smoker  . Smokeless tobacco: Never Used  Vaping Use  . Vaping Use: Former  . Quit date: 08/24/2019  Substance Use Topics  . Alcohol use: Not Currently    Comment: Last ETOH use 4 months ago.  . Drug use: Not Currently    Types: Marijuana    Comment: Last marijuana use one month ago.    Home Medications Prior to Admission medications   Medication Sig Start Date End Date Taking? Authorizing Provider  docusate sodium (COLACE) 100 MG capsule Take 1 capsule (100 mg total) by mouth 2 (two) times daily as needed. Patient not taking: Reported on 08/15/2020 10/10/19   Natale Milch, MD  famotidine (PEPCID) 20 MG tablet Take 1 tablet (20 mg total) by mouth 2 (two) times daily as needed for heartburn or indigestion. Patient not taking: Reported on 08/15/2020 04/23/20   Mirna Mires, CNM  Prenatal Vit-Fe Fumarate-FA (PRENATAL VITAMIN) 27-0.8 MG TABS  Take 1 tablet by mouth daily at 6 (six) AM. Patient not taking: Reported on 08/15/2020 09/24/19   Federico Flake, MD    Allergies    Citrus  Review of Systems   Review of Systems  Gastrointestinal: Positive for abdominal pain.  Ten systems reviewed and are negative for acute change, except as noted in the HPI.    Physical Exam Updated Vital Signs BP 110/76   Pulse (!) 54   Temp 98 F (36.7 C) (Oral)   Resp 18   Ht 5\' 2"  (1.575 m)   Wt 73.5 kg   LMP 07/11/2020   SpO2 100%   BMI 29.65 kg/m   Physical Exam Vitals and nursing note reviewed.  Constitutional:      General: She is not in acute distress.    Appearance: She is well-developed. She is not diaphoretic.  HENT:     Head:  Normocephalic and atraumatic.  Eyes:     General: No scleral icterus.    Conjunctiva/sclera: Conjunctivae normal.  Cardiovascular:     Rate and Rhythm: Normal rate and regular rhythm.     Pulses: Normal pulses.  Pulmonary:     Effort: Pulmonary effort is normal. No respiratory distress.     Comments: Respirations even and unlabored Abdominal:     Palpations: Abdomen is soft. There is no mass.     Tenderness: There is abdominal tenderness (mild to LLQ, suprapubic abdomen). There is no guarding.     Comments: No peritoneal signs.  Low-lying C-section scar appears well-healed without associated erythema, induration, dehiscence.  Musculoskeletal:        General: Normal range of motion.     Cervical back: Normal range of motion.  Skin:    General: Skin is warm and dry.     Coloration: Skin is not pale.     Findings: No erythema or rash.  Neurological:     Mental Status: She is alert and oriented to person, place, and time.     Coordination: Coordination normal.  Psychiatric:        Mood and Affect: Mood is anxious.        Speech: Speech normal.        Behavior: Behavior normal.     ED Results / Procedures / Treatments   Labs (all labs ordered are listed, but only abnormal results are displayed) Labs Reviewed  COMPREHENSIVE METABOLIC PANEL - Abnormal; Notable for the following components:      Result Value   Glucose, Bld 127 (*)    All other components within normal limits  CBC - Abnormal; Notable for the following components:   MCH 25.8 (*)    All other components within normal limits  URINALYSIS, ROUTINE W REFLEX MICROSCOPIC - Abnormal; Notable for the following components:   APPearance CLOUDY (*)    Leukocytes,Ua TRACE (*)    Bacteria, UA RARE (*)    All other components within normal limits  LIPASE, BLOOD  HCG, QUANTITATIVE, PREGNANCY    EKG ED ECG REPORT   Date: 08/15/2020  Rate: 63  Rhythm: normal sinus rhythm  QRS Axis: normal  Intervals: normal  ST/T Wave  abnormalities: normal  Conduction Disutrbances:none  Narrative Interpretation: Normal sinus rhythm  Old EKG Reviewed: none available  I have personally reviewed the EKG tracing and agree with the computerized printout as noted.   Radiology No results found.  Procedures Procedures (including critical care time)  Medications Ordered in ED Medications  ketorolac (TORADOL) 15 MG/ML injection 15  mg (15 mg Intravenous Given 08/15/20 0252)  sodium chloride 0.9 % bolus 1,000 mL (0 mLs Intravenous Stopped 08/15/20 0443)    ED Course  I have reviewed the triage vital signs and the nursing notes.  Pertinent labs & imaging results that were available during my care of the patient were reviewed by me and considered in my medical decision making (see chart for details).  Clinical Course as of Aug 15 613  Sat Aug 15, 2020  4034 Patient states that pain is improved.  She is no longer feeling lightheaded.  Repeat abdominal exam improved.  States that she is feeling little abdominal pain presently.  Do not feel emergent imaging is presently indicated.  She may be experiencing some scar tissue associated with her C-section from 3 months ago.  Patient expresses comfort with outpatient management and the use of Tylenol/ibuprofen for pain.  Return precautions discussed.   [KH]    Clinical Course User Index [KH] Antony Madura, PA-C   MDM Rules/Calculators/A&P                          19 year old female presenting to the ED for nonspecific abdominal cramping.  This has been improving since arrival after receiving Toradol and IV fluids.  While she has some lower abdominal tenderness, her exam is fairly benign.  No peritoneal signs.  Repeat abdominal exam improved on repeat assessment.  No fever or leukocytosis to suggest infectious etiology.  Urinalysis negative for UTI.  No significant electrolyte derangements.  Liver and kidney function preserved.    Was feeling lightheaded around the time of her  abdominal pain onset, but endorses feeling lightheaded fairly frequently.  She is not orthostatic and has not had any syncopal episode.  EKG shows NSR.  Question whether abdominal cramping may be related to scar tissue associated with recent cesarean section.  There is no evidence of secondary infection/cellulitis at site of C-section scar.  In light of reassuring labs, improving exam, I feel it is appropriate for the patient to follow-up with her primary care doctor for repeat assessment.  Advised use of Tylenol or ibuprofen for pain control.  Return precautions discussed and provided. Patient discharged in stable condition with no unaddressed concerns.   Final Clinical Impression(s) / ED Diagnoses Final diagnoses:  Abdominal pain, unspecified abdominal location  Lightheadedness    Rx / DC Orders ED Discharge Orders    None       Antony Madura, PA-C 08/15/20 0618    Antony Madura, PA-C 08/15/20 7425    Rolan Bucco, MD 08/15/20 [phone]

## 2020-08-15 NOTE — ED Notes (Signed)
Assumed care of patient at this time, nad noted, sr up x2, bed locked and low, call bell w/I reach.  Will continue to monitor. ° °

## 2020-08-17 ENCOUNTER — Telehealth: Payer: Self-pay | Admitting: Obstetrics and Gynecology

## 2020-08-17 NOTE — Telephone Encounter (Signed)
Patient is scheduled for 09/03/20 at 10:50 in [place] for nexplanon placement

## 2020-08-18 ENCOUNTER — Ambulatory Visit: Payer: Medicaid Other

## 2020-08-18 NOTE — Telephone Encounter (Signed)
Noted. Will order to arrive by apt date/time. 

## 2020-08-21 ENCOUNTER — Ambulatory Visit: Payer: Medicaid Other

## 2020-08-21 ENCOUNTER — Other Ambulatory Visit: Payer: Self-pay

## 2020-09-03 ENCOUNTER — Ambulatory Visit: Payer: Medicaid Other | Admitting: Obstetrics and Gynecology

## 2020-09-03 NOTE — Telephone Encounter (Signed)
Patient is reschedule to 09/24/20 at 10 am with AMS in [facility]

## 2020-09-13 ENCOUNTER — Emergency Department (HOSPITAL_COMMUNITY): Payer: Medicaid Other

## 2020-09-13 ENCOUNTER — Emergency Department (HOSPITAL_COMMUNITY)
Admission: EM | Admit: 2020-09-13 | Discharge: 2020-09-13 | Disposition: A | Discharge status: Home / Self Care | Payer: Medicaid Other | Attending: Emergency Medicine | Admitting: Emergency Medicine

## 2020-09-13 ENCOUNTER — Other Ambulatory Visit: Payer: Self-pay

## 2020-09-13 ENCOUNTER — Encounter (HOSPITAL_COMMUNITY): Payer: Self-pay | Admitting: *Deleted

## 2020-09-13 DIAGNOSIS — R110 Nausea: Secondary | ICD-10-CM | POA: Insufficient documentation

## 2020-09-13 DIAGNOSIS — S0990XA Unspecified injury of head, initial encounter: Secondary | ICD-10-CM | POA: Diagnosis present

## 2020-09-13 DIAGNOSIS — W52XXXA Crushed, pushed or stepped on by crowd or human stampede, initial encounter: Secondary | ICD-10-CM | POA: Insufficient documentation

## 2020-09-13 LAB — I-STAT BETA HCG BLOOD, ED (MC, WL, AP ONLY): I-stat hCG, quantitative: <5 m[IU]/mL (ref <5)

## 2020-09-13 MED ORDER — ACETAMINOPHEN 325 MG PO TABS
650.0000 mg | ORAL_TABLET | Freq: Once | ORAL | Status: AC
  Administered 2020-09-13: 650 mg via ORAL
  Filled 2020-09-13: qty 2

## 2020-09-13 MED ORDER — ONDANSETRON 4 MG PO TBDP
8.0000 mg | ORAL_TABLET | Freq: Once | ORAL | Status: AC
  Administered 2020-09-13: 8 mg via ORAL
  Filled 2020-09-13: qty 2

## 2020-09-13 MED ORDER — IBUPROFEN 800 MG PO TABS: 800.0000 mg | ORAL_TABLET | Freq: Three times a day (TID) | ORAL | 0 refills | Status: DC

## 2020-09-13 NOTE — ED Notes (Signed)
Per provider pt PO tested Able to drink water and eat crackers without increased abdominal pain and no vomiting.
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
## 2020-09-24 ENCOUNTER — Ambulatory Visit: Payer: Medicaid Other | Admitting: Obstetrics and Gynecology

## 2021-02-26 ENCOUNTER — Encounter: Payer: Self-pay | Admitting: Advanced Practice Midwife

## 2021-02-26 ENCOUNTER — Other Ambulatory Visit: Payer: Self-pay

## 2021-02-26 ENCOUNTER — Other Ambulatory Visit (HOSPITAL_COMMUNITY)
Admission: RE | Admit: 2021-02-26 | Discharge: 2021-02-26 | Disposition: A | Discharge status: Home / Self Care | Payer: Medicaid Other | Source: Ambulatory Visit | Attending: Advanced Practice Midwife | Admitting: Advanced Practice Midwife

## 2021-02-26 ENCOUNTER — Ambulatory Visit (INDEPENDENT_AMBULATORY_CARE_PROVIDER_SITE_OTHER): Payer: Medicaid Other | Admitting: Advanced Practice Midwife

## 2021-02-26 VITALS — BP 92/60 | Ht 62.0 in | Wt 133.0 lb

## 2021-02-26 DIAGNOSIS — N898 Other specified noninflammatory disorders of vagina: Secondary | ICD-10-CM

## 2021-02-26 MED ORDER — METRONIDAZOLE 0.75 % VA GEL: 1.0000 | Freq: Every day | VAGINAL | 1 refills | Status: AC

## 2021-02-28 ENCOUNTER — Encounter: Payer: Self-pay | Admitting: Advanced Practice Midwife

## 2021-02-28 NOTE — Progress Notes (Addendum)
Patient ID: Denise Hardin, female   DOB: Nov 10, 2000, 20 y.o.   MRN: [id number]  Reason for Consult: Vaginal Discharge (Itching, irritation, no odor x 2 days)    Subjective:  Date of Service: 02/26/2021  HPI:  Denise Hardin is a 20 y.o. female being seen for symptoms of vaginitis. She has also recently taken a positive pregnancy test. She is scheduled for a NOB visit next week. Her symptoms for the past 2 days are itching, irritation, thick white discharge. She denies odor or urinary symptoms. She did try Monistat yesterday and says it caused an increase in irritation. She denies a history of yeast infections. She has had a UTI in the past. She describes her symptoms as moderately uncomfortable and requests medication to treat. We discussed starting with metronidazole gel (avoid PO in early pregnancy) in case of BV. Rx's for vaginal yeast treatment are not covered by her insurance. Message sent to patient recommending clotrimazole OTC vaginal cream instead of monistat. Will also follow up after lab results. She accepts STD testing as well as it will be done for her pregnancy.  Past Medical History:  Diagnosis Date  . Anemia   . History of urinary tract infection   . No known health problems    Family History  Problem Relation Age of Onset  . Diabetes Maternal Grandmother   . Ovarian cancer Paternal Grandmother        40s/50s   Past Surgical History:  Procedure Laterality Date  . NO PAST SURGERIES      Short Social History:  Social History   Tobacco Use  . Smoking status: Never Smoker  . Smokeless tobacco: Never Used  Substance Use Topics  . Alcohol use: Not Currently    Comment: Last ETOH use 4 months ago.    Allergies  Allergen Reactions  . Citrus Rash    Skin irritation from citrus peels.    Current Outpatient Medications  Medication Sig Dispense Refill  . metroNIDAZOLE (METROGEL) 0.75 % vaginal gel Place 1 Applicatorful vaginally at bedtime for 5 days. 70 g 1    No current facility-administered medications for this visit.    Review of Systems  Constitutional: Positive for malaise/fatigue. Negative for chills and fever.  HENT: Negative for congestion, ear discharge, ear pain, hearing loss, sinus pain and sore throat.   Eyes: Negative for blurred vision and double vision.  Respiratory: Negative for cough, shortness of breath and wheezing.   Cardiovascular: Negative for chest pain, palpitations and leg swelling.  Gastrointestinal: Negative for abdominal pain, blood in stool, constipation, diarrhea, heartburn, melena, nausea and vomiting.  Genitourinary: Negative for dysuria, flank pain, frequency, hematuria and urgency.       Positive for vaginal irritation, itching, discharge  Musculoskeletal: Negative for back pain, joint pain and myalgias.  Skin: Negative for itching and rash.  Neurological: Negative for dizziness, tingling, tremors, sensory change, speech change, focal weakness, seizures, loss of consciousness, weakness and headaches.  Endo/Heme/Allergies: Negative for environmental allergies. Does not bruise/bleed easily.       Positive for hot flashes  Psychiatric/Behavioral: Negative for depression, hallucinations, memory loss, substance abuse and suicidal ideas. The patient is not nervous/anxious and does not have insomnia.        Positive for anxiety       Objective:  Objective   Vitals:   02/26/21 1527  BP: 92/60  Weight: 133 lb (60.3 kg)  Height: 5\' 2"  (1.575 m)   Body mass index is 24.33 kg/m.  Constitutional: Well nourished, well developed female in no acute distress.  HEENT: normal Skin: Warm and dry.  Cardiovascular: Regular rate and rhythm.   Extremity: no edema Respiratory: Clear to auscultation bilateral. Normal respiratory effort Neuro: DTRs 2+, Cranial nerves grossly intact Psych: Alert and Oriented x3. No memory deficits. Normal mood and affect.  MS: normal gait, normal bilateral lower extremity  ROM/strength/stability.  Pelvic exam:  is not limited by body habitus EGBUS: within normal limits Vagina: within normal limits and with normal mucosa, thin white discharge   Assessment/Plan:     20 y.o. G3 P63 female with possible vaginitis, will treat empirically for BV based on patient's level of discomfort  Aptima: vaginitis/STDs Rx metro gel Clotrimazole OTC as needed for yeast Follow up as needed after lab results   Tresea Mall CNM [facility] 02/28/2021, 3:19 PM

## 2021-03-01 ENCOUNTER — Other Ambulatory Visit: Payer: Self-pay

## 2021-03-01 ENCOUNTER — Encounter: Payer: Self-pay | Admitting: Obstetrics and Gynecology

## 2021-03-01 ENCOUNTER — Ambulatory Visit (INDEPENDENT_AMBULATORY_CARE_PROVIDER_SITE_OTHER): Payer: Medicaid Other | Admitting: Obstetrics and Gynecology

## 2021-03-01 VITALS — BP 102/62 | HR 66 | Wt 132.0 lb

## 2021-03-01 DIAGNOSIS — N912 Amenorrhea, unspecified: Secondary | ICD-10-CM

## 2021-03-01 DIAGNOSIS — Z3480 Encounter for supervision of other normal pregnancy, unspecified trimester: Secondary | ICD-10-CM | POA: Insufficient documentation

## 2021-03-01 DIAGNOSIS — Z113 Encounter for screening for infections with a predominantly sexual mode of transmission: Secondary | ICD-10-CM

## 2021-03-01 DIAGNOSIS — Z7185 Encounter for immunization safety counseling: Secondary | ICD-10-CM | POA: Diagnosis not present

## 2021-03-01 DIAGNOSIS — O219 Vomiting of pregnancy, unspecified: Secondary | ICD-10-CM

## 2021-03-01 DIAGNOSIS — Z3481 Encounter for supervision of other normal pregnancy, first trimester: Secondary | ICD-10-CM

## 2021-03-01 DIAGNOSIS — Z3149 Encounter for other procreative investigation and testing: Secondary | ICD-10-CM

## 2021-03-01 DIAGNOSIS — Z3A08 8 weeks gestation of pregnancy: Secondary | ICD-10-CM | POA: Diagnosis not present

## 2021-03-01 DIAGNOSIS — O09891 Supervision of other high risk pregnancies, first trimester: Secondary | ICD-10-CM | POA: Insufficient documentation

## 2021-03-01 DIAGNOSIS — Z98891 History of uterine scar from previous surgery: Secondary | ICD-10-CM | POA: Diagnosis not present

## 2021-03-01 DIAGNOSIS — O281 Abnormal biochemical finding on antenatal screening of mother: Secondary | ICD-10-CM

## 2021-03-01 LAB — POCT URINALYSIS DIPSTICK OB
Appearance: NORMAL
Bilirubin, UA: NEGATIVE
Blood, UA: NEGATIVE
Glucose, UA: NEGATIVE
Ketones, UA: NEGATIVE
Leukocytes, UA: NEGATIVE
Nitrite, UA: NEGATIVE
Odor: NORMAL
POC,PROTEIN,UA: NEGATIVE
Spec Grav, UA: 1.02 (ref 1.010–1.025)
Urobilinogen, UA: 0.2 E.U./dL
pH, UA: 6 (ref 5.0–8.0)

## 2021-03-01 LAB — POCT URINE PREGNANCY: Preg Test, Ur: POSITIVE — AB

## 2021-03-01 MED ORDER — DOXYLAMINE-PYRIDOXINE 10-10 MG PO TBEC: 2.0000 | DELAYED_RELEASE_TABLET | Freq: Every day | ORAL | 5 refills | Status: AC

## 2021-03-01 NOTE — Progress Notes (Signed)
Positive home test 2 wks ago.

## 2021-03-01 NOTE — Progress Notes (Signed)
New Obstetric Patient H&P    Chief Complaint: Missed period, +UPT   History of Present Illness: Patient is a 20 y.o. [id number] Not Hispanic or Latino female, presents with amenorrhea and positive home pregnancy test. Patient's last menstrual period was 12/29/2020 (approximate). and based on her  LMP, her EDD is Estimated Date of Delivery: 10/05/21 and her EGA is [redacted]w[redacted]d. Cycles are 7. days, regular, and occur approximately every : 28 days.   She had a urine pregnancy test which was positive 2 week(s)  ago. Her last menstrual period was normal and lasted for  7 day(s). Since her LMP she claims she has experienced an increase in sx of N/V, fatigue, and headaches. She denies vaginal bleeding. Her past medical history is noncontributory. Her prior pregnancies are notable for G1- NSVD, pelsive proven to 7lb 4oz. G2- primary c/s for fetal intolerance of labor.  Since her LMP, she admits to the use of tobacco products  no She claims she has gained 2lb pounds since the start of her pregnancy.  There are cats in the home in the home  no  She admits close contact with children on a regular basis  yes  She has had chicken pox in the past no She has had Tuberculosis exposures, symptoms, or previously tested positive for TB   no Current or past history of domestic violence. no  Genetic Screening/Teratology Counseling: (Includes patient, baby's father, or anyone in either family with:)   1. Patient's age >/= 68 at [facility]  no 2. Thalassemia ([place], [place], Mediterranean, or Asian background): MCV<80  no 3. Neural tube defect (meningomyelocele, spina bifida, anencephaly)  no 4. Congenital heart defect  no  5. Down syndrome  no 6. Tay-Sachs (Jewish, [place]  no 7. Canavan's Disease  no 8. Sickle cell disease or trait (African)  no  9. Hemophilia or other blood disorders  no  10. Muscular dystrophy  no  11. Cystic fibrosis  no  12. Huntington's Chorea  no  13. Mental retardation/autism  no 14.  Other inherited genetic or chromosomal disorder  no 15. Maternal metabolic disorder (DM, PKU, etc)  no 16. Patient or FOB with a child with a birth defect not listed above no  16a. Patient or FOB with a birth defect themselves no 17. Recurrent pregnancy loss, or stillbirth  no  18. Any medications since LMP other than prenatal vitamins (include vitamins, supplements, OTC meds, drugs, alcohol)  no 19. Any other genetic/environmental exposure to discuss  no  Infection History:   1. Lives with someone with TB or TB exposed  no  2. Patient or partner has history of genital herpes  no 3. Rash or viral illness since LMP  no 4. History of STI (GC, CT, HPV, syphilis, HIV)  yes 5. History of recent travel :  no  Other pertinent information:  Lives with father, brothers, and two children. 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  Review of Systems:10 point review of systems negative unless otherwise noted in HPI  Past Medical History:  Patient Active Problem List   Diagnosis Date Noted  . Encounter for supervision of other normal pregnancy, first trimester 03/01/2021     Nursing Staff Provider  Office Location  [facility] Dating   LMP   Language  English Anatomy US    Flu Vaccine   Genetic Screen  NIPS:  desires  TDaP vaccine    Hgb A1C or  GTT Early : n/a Third trimester :   Rhogam  LAB RESULTS   Feeding Plan  breast Blood Type     Contraception  Antibody    Circumcision  Rubella    Pediatrician   RPR   Support Person  mom/grandmother HBsAg   Prenatal Classes  HIV     Varicella @[email or web address] @   BTL Consent   GBS (For PCN allergy, check sensitivities)        VBAC Consent  desires VBAC Pap  n/a <21 yo    Hgb Electro      CF      SMA            . History of cesarean section 03/01/2021  . Short interval between pregnancies affecting pregnancy in first trimester, antepartum 03/01/2021  . Migraine 10/05/2017    Past Surgical History:  Past Surgical History:  Procedure  Laterality Date  . NO PAST SURGERIES      Gynecologic History: Patient's last menstrual period was 12/29/2020 (approximate).  Obstetric History: 02/28/2021  Family History:  Family History  Problem Relation Age of Onset  . Diabetes Maternal Grandmother   . Ovarian cancer Paternal Grandmother        40s/50s    Social History:  Social History   Socioeconomic History  . Marital status: Single    Spouse name: Not on file  . Number of children: Not on file  . Years of education: Not on file  . Highest education level: Not on file  Occupational History  . Not on file  Tobacco Use  . Smoking status: Never Smoker  . Smokeless tobacco: Never Used  Vaping Use  . Vaping Use: Former  . Quit date: 08/24/2019  Substance and Sexual Activity  . Alcohol use: Not Currently    Comment: Last ETOH use 4 months ago.  . Drug use: Not Currently    Types: Marijuana    Comment: Last marijuana use one month ago.  08/26/2019 Sexual activity: Not Currently    Birth control/protection: None  Other Topics Concern  . Not on file  Social History Narrative  . Not on file   Social Determinants of Health   Financial Resource Strain: Not on file  Food Insecurity: Not on file  Transportation Needs: Not on file  Physical Activity: Not on file  Stress: Not on file  Social Connections: Not on file  Intimate Partner Violence: Not on file    Allergies:  Allergies  Allergen Reactions  . Citrus Rash    Skin irritation from citrus peels.    Medications: Prior to Admission medications   Medication Sig Start Date End Date Taking? Authorizing Provider  metroNIDAZOLE (METROGEL) 0.75 % vaginal gel Place 1 Applicatorful vaginally at bedtime for 5 days. 02/26/21 03/03/21 Yes 05/03/21, CNM  Prenatal Vit-Fe Fumarate-FA (MULTIVITAMIN-PRENATAL) 27-0.8 MG TABS tablet Take 1 tablet by mouth daily at 12 noon.   Yes [provider]    Physical Exam Vitals: Blood pressure 102/62, pulse 66, weight 132 lb  (59.9 kg), last menstrual period 12/29/2020, currently breastfeeding.  General: NAD HEENT: normocephalic, anicteric Thyroid: no enlargement, no palpable nodules Pulmonary: No increased work of breathing, CTAB Cardiovascular: RRR, distal pulses 2+ Abdomen: NABS, soft, non-tender, non-distended.  Umbilicus without lesions.  No hepatomegaly, splenomegaly or masses palpable. No evidence of hernia.  Genitourinary:  External: Normal external female genitalia.  Normal urethral meatus, normal Bartholin's and Skene's glands.    Vagina: Normal vaginal mucosa, no evidence of prolapse.    Cervix: Grossly normal in appearance, no bleeding  Deferred bimanual  exam  Rectal: deferred Extremities: no edema, erythema, or tenderness Neurologic: Grossly intact Psychiatric: mood appropriate, affect full  Assessment: 20 y.o. [id number] at [redacted]w[redacted]d presenting to initiate prenatal care  Plan: 1) Avoid alcoholic beverages. 2) Patient encouraged not to smoke.  3) Discontinue the use of all non-medicinal drugs and chemicals.  4) Take prenatal vitamins daily.  5) Nutrition, food safety (fish, cheese advisories, and high nitrite foods) and exercise discussed. 6) Hospital and practice style discussed with cross coverage system.  7) Genetic Screening, such as with 1st Trimester Screening, cell free fetal DNA, AFP testing, and Ultrasound, as well as with amniocentesis and CVS as appropriate, is discussed with patient. At the conclusion of today's visit patient requested genetic testing - Inheritest today - NIPS at next visit  8)NOB labs/urine collected today - completed STI screening at visit on 02/26/21  9) Plan to RTC in 2 weeks for ROB with MD and dating [place]  10) Diclegis rx'd today for n/v - f/u if sx persist  Zipporah Plants, CNM, MSN [facility], [facility] 03/01/2021, 9:50 AM

## 2021-03-02 LAB — RPR+RH+ABO+RUB AB+AB SCR+CB...
Antibody Screen: NEGATIVE
HIV Screen 4th Generation wRfx: NONREACTIVE
Hematocrit: 40.3 % (ref 34.0–46.6)
Hemoglobin: 13.3 g/dL (ref 11.1–15.9)
Hepatitis B Surface Ag: NEGATIVE
MCH: 28.7 pg (ref 26.6–33.0)
MCHC: 33 g/dL (ref 31.5–35.7)
MCV: 87 fL (ref 79–97)
Platelets: 301 10*3/uL (ref 150–450)
RBC: 4.64 x10E6/uL (ref 3.77–5.28)
RDW: 12.7 % (ref 11.7–15.4)
RPR Ser Ql: NONREACTIVE
Rh Factor: POSITIVE
Rubella Antibodies, IGG: <0.9 index — ABNORMAL LOW (ref >0.99)
Varicella zoster IgG: 351 index (ref >165)
WBC: 7.7 10*3/uL (ref 3.4–10.8)

## 2021-03-02 LAB — CERVICOVAGINAL ANCILLARY ONLY
Bacterial Vaginitis (gardnerella): NEGATIVE
Candida Glabrata: NEGATIVE
Candida Vaginitis: POSITIVE — AB
Chlamydia: NEGATIVE
Comment: NEGATIVE
Comment: NEGATIVE
Comment: NEGATIVE
Comment: NEGATIVE
Comment: NEGATIVE
Comment: NORMAL
Neisseria Gonorrhea: NEGATIVE
Trichomonas: NEGATIVE

## 2021-03-03 LAB — URINE CULTURE

## 2021-03-10 LAB — INHERITEST CORE(CF97,SMA,FRAX)

## 2021-03-11 ENCOUNTER — Ambulatory Visit (INDEPENDENT_AMBULATORY_CARE_PROVIDER_SITE_OTHER): Payer: Medicaid Other | Admitting: Obstetrics & Gynecology

## 2021-03-11 ENCOUNTER — Encounter: Payer: Self-pay | Admitting: Obstetrics & Gynecology

## 2021-03-11 ENCOUNTER — Other Ambulatory Visit: Payer: Self-pay

## 2021-03-11 VITALS — BP 120/80 | Wt 135.0 lb

## 2021-03-11 DIAGNOSIS — Z3689 Encounter for other specified antenatal screening: Secondary | ICD-10-CM

## 2021-03-11 DIAGNOSIS — Z3481 Encounter for supervision of other normal pregnancy, first trimester: Secondary | ICD-10-CM

## 2021-03-11 DIAGNOSIS — O09891 Supervision of other high risk pregnancies, first trimester: Secondary | ICD-10-CM

## 2021-03-11 DIAGNOSIS — Z98891 History of uterine scar from previous surgery: Secondary | ICD-10-CM

## 2021-03-11 DIAGNOSIS — Z3A10 10 weeks gestation of pregnancy: Secondary | ICD-10-CM

## 2021-03-11 LAB — POCT URINALYSIS DIPSTICK OB
Glucose, UA: NEGATIVE
POC,PROTEIN,UA: NEGATIVE

## 2021-03-11 NOTE — Progress Notes (Signed)
ULTRASOUND REPORT  Location: [facility] Date of Service: 03/11/2021   Indications:Unsure LMP Findings:  Mason Jim intrauterine pregnancy is visualized with a CRL consistent with [redacted]w[redacted]d gestation, giving an (U/S) EDD of 10/29/2021. The (U/S) EDD is not consistent with the clinically established EDD of 10/05/21.  FHR: 150 BPM CRL measurement: 8.4 mm Yolk sac is visualized and appears normal. Amnion: visualized and appears normal   Right Ovary is normal in appearance. Left Ovary is normal appearance. Corpus luteal cyst:  is not visualized Survey of the adnexa demonstrates no adnexal masses. There is no free peritoneal fluid in the cul de sac.  Impression: 1. [redacted]w[redacted]d Viable Singleton Intrauterine pregnancy by U/S. 2. (U/S) EDD is consistent with Clinically established EDD of 10/29/2021.  Recommendations: 1.Clinical correlation with the patient's History and Physical Exam. 2. Change EDC based on todays reassuring ultrasound  Letitia Libra, MD

## 2021-03-11 NOTE — Progress Notes (Signed)
  Subjective  Nausea present, controlled w meds No pain or bleeding  Objective  BP 120/80   Wt 135 lb (61.2 kg)   LMP 12/29/2020 (Approximate)   BMI 24.69 kg/m  General: NAD Pumonary: no increased work of breathing Abdomen: gravid, non-tender Extremities: no edema Psychiatric: mood appropriate, affect full  Assessment  20 y.o. [id number] at [redacted]w[redacted]d by  10/29/2021, by Ultrasound presenting for routine prenatal visit  Plan   Problem List Items Addressed This Visit      Other   Encounter for supervision of other normal pregnancy, first trimester   History of cesarean section   Short interval between pregnancies affecting pregnancy in first trimester, antepartum    Other Visit Diagnoses    [redacted] weeks gestation of pregnancy    -  Primary   Relevant Orders   POC Urinalysis Dipstick OB (Completed)   Screening, antenatal, for fetal anatomic survey       Relevant Orders   US OB Comp + 14 Wk    See [place] report    Adjust EDC PNV Nausea mgt discussed NIPT nv  Pregnancy #3 Problems (from 03/01/21 to present)    Problem Noted Resolved   Encounter for supervision of other normal pregnancy, first trimester     Overview Addendum 03/02/2021  9:03 AM by Zipporah Plants, CNM     Nursing Staff Provider  Office Location  [facility] Dating   [place] 6 weeks  Language  English Anatomy US    Flu Vaccine   Genetic Screen  NIPS:  desires  TDaP vaccine    Hgb A1C or  GTT Early : n/a Third trimester :   Rhogam     LAB RESULTS   Feeding Plan  breast Blood Type   O+  Contraception  Antibody   Negative  Circumcision  Rubella   Non-immune  Pediatrician   RPR   Non-reactive  Support Person  mom/grandmother HBsAg   Negative  Prenatal Classes  HIV   Non-reactive    Varicella   Immune  BTL Consent   GBS (For PCN allergy, check sensitivities)        VBAC Consent  desires VBAC Pap  n/a <21 yo    Hgb Electro      CF      SMA               Denise Major, MD, [facility], [facility] 03/11/2021  2:42 PM

## 2021-03-11 NOTE — Patient Instructions (Signed)

## 2021-04-08 ENCOUNTER — Other Ambulatory Visit: Payer: Self-pay

## 2021-04-08 ENCOUNTER — Ambulatory Visit (INDEPENDENT_AMBULATORY_CARE_PROVIDER_SITE_OTHER): Payer: Medicaid Other | Admitting: Obstetrics and Gynecology

## 2021-04-08 VITALS — BP 100/60 | Wt 133.0 lb

## 2021-04-08 DIAGNOSIS — O26891 Other specified pregnancy related conditions, first trimester: Secondary | ICD-10-CM

## 2021-04-08 DIAGNOSIS — R519 Headache, unspecified: Secondary | ICD-10-CM

## 2021-04-08 DIAGNOSIS — Z3A10 10 weeks gestation of pregnancy: Secondary | ICD-10-CM

## 2021-04-08 DIAGNOSIS — Z3481 Encounter for supervision of other normal pregnancy, first trimester: Secondary | ICD-10-CM

## 2021-04-08 DIAGNOSIS — Z1379 Encounter for other screening for genetic and chromosomal anomalies: Secondary | ICD-10-CM

## 2021-04-08 MED ORDER — BUTALBITAL-APAP-CAFFEINE 50-325-40 MG PO CAPS: 1.0000 | ORAL_CAPSULE | Freq: Four times a day (QID) | ORAL | 3 refills | Status: AC | PRN

## 2021-04-08 MED ORDER — BUTALBITAL-APAP-CAFFEINE 50-325-40 MG PO CAPS: 1.0000 | ORAL_CAPSULE | Freq: Four times a day (QID) | ORAL | 3 refills | Status: DC | PRN

## 2021-04-08 NOTE — Progress Notes (Signed)
Routine Prenatal Care Visit  Subjective  Denise Hardin is a 20 y.o. G3P2002 at [redacted]w[redacted]d being seen today for ongoing prenatal care.  She is currently monitored for the following issues for this low-risk pregnancy and has Migraine; Encounter for supervision of other normal pregnancy, first trimester; History of cesarean section; and Short interval between pregnancies affecting pregnancy in first trimester, antepartum on their problem list.  ----------------------------------------------------------------------------------- Patient reports intermittent headaches. Reports she has had an increase in migraines with pain on the right side of her head. Denies auras associated with migraines..   Contractions: Not present. Vag. Bleeding: None.   . Denies leaking of fluid.  ----------------------------------------------------------------------------------- The following portions of the patient's history were reviewed and updated as appropriate: allergies, current medications, past family history, past medical history, past social history, past surgical history and problem list. Problem list updated.   Objective  Blood pressure 100/60, weight 133 lb (60.3 kg), last menstrual period 12/29/2020, currently breastfeeding. Pregravid weight 130 lb (59 kg) Total Weight Gain 3 lb (1.361 kg) Urinalysis:      Fetal Status: Fetal Heart Rate (bpm): 155         General:  Alert, oriented and cooperative. Patient is in no acute distress.  Skin: Skin is warm and dry. No rash noted.   Cardiovascular: Normal heart rate noted  Respiratory: Normal respiratory effort, no problems with respiration noted  Abdomen: Soft, gravid, appropriate for gestational age. Pain/Pressure: Absent     Pelvic:  Cervical exam deferred        Extremities: Normal range of motion.     ental Status: Normal mood and affect. Normal behavior. Normal judgment and thought content.     Assessment   20 y.o. [id number] at [redacted]w[redacted]d by  10/29/2021, by  Ultrasound presenting for routine prenatal visit  Plan   Pregnancy #3 Problems (from 03/01/21 to present)     Problem Noted Resolved   Encounter for supervision of other normal pregnancy, first trimester 03/01/2021 by Zipporah Plants, CNM No   Overview Addendum 03/16/2021  8:54 AM by Zipporah Plants, CNM     Nursing Staff Provider  Office Location  [facility] Dating   Lby [place] 6 wks  Language  English Anatomy US    Flu Vaccine   Genetic Screen  NIPS:  desires  TDaP vaccine    Hgb A1C or  GTT Early : n/a Third trimester :   Rhogam  n/a   LAB RESULTS   Feeding Plan  breast Blood Type   O+  Contraception  please Antibody   Negative  Circumcision  Rubella   Non-immune  Pediatrician   RPR   Non-reactive  Support Person  mom/grandmother HBsAg   Negative  Prenatal Classes  HIV   Non-reactive    Varicella   Immune  BTL Consent   GBS (For PCN allergy, check sensitivities)        VBAC Consent  desires VBAC Pap  n/a <21 yo   Prior CS FITL, prior NSVD Hgb Electro      CF  negative     SMA  negative              History of cesarean section 03/01/2021 by Zipporah Plants, CNM No   Overview Signed 03/11/2021  2:46 PM by Nadara Mustard, MD    OB/GYN  Counseling Note  20 y.o. [phone] at [redacted]w[redacted]d with Estimated Date of Delivery: 10/29/21 was seen today in office to discuss trial of labor after cesarean section (  TOLAC) versus elective repeat cesarean delivery (ERCD). The following risks were discussed with the patient.  Risk of uterine rupture at term is 0.78 percent with TOLAC and 0.22 percent with ERCD. 1 in 10 uterine ruptures will result in neonatal death or neurological injury. The benefits of a trial of labor after cesarean (TOLAC) resulting in a vaginal birth after cesarean (VBAC) include the following: shorter length of hospital stay and postpartum recovery (in most cases); fewer complications, such as postpartum fever, wound or uterine infection, thromboembolism (blood clots in the leg or lung), need for  blood transfusion and fewer neonatal breathing problems.  The risks of an attempted VBAC or TOLAC include the following: . Risk of failed trial of labor after cesarean (TOLAC) without a vaginal birth after cesarean (VBAC) resulting in repeat cesarean delivery (RCD) in about 20 to 40 percent of women who attempt VBAC.  Marland Kitchen Risk of rupture of uterus resulting in an emergency cesarean delivery. The risk of uterine rupture may be related in part to the type of uterine incision made during the first cesarean delivery. A previous transverse uterine incision has the lowest risk of rupture (0.2 to 1.5 percent risk). Vertical or T-shaped uterine incisions have a higher risk of uterine rupture (4 to 9 percent risk)The risk of fetal death is very low with both VBAC and elective repeat cesarean delivery (ERCD), but the likelihood of fetal death is higher with VBAC than with ERCD. Maternal death is very rare with either type of delivery.  The risks of an elective repeat cesarean delivery (ERCD) were reviewed with the patient including but not limited to: 11/998 risk of uterine rupture which could have serious consequences, bleeding which may require transfusion; infection which may require antibiotics; injury to bowel, bladder or other surrounding organs (bowel, bladder, ureters); injury to the fetus; need for additional procedures including hysterectomy in the event of a life-threatening hemorrhage; thromboembolic phenomenon; abnormal placentation; incisional problems; death and other postoperative or anesthesia complications.    Desires VBAC Prior NSVD, good candidate       Short interval between pregnancies affecting pregnancy in first trimester, antepartum 03/01/2021 by Zipporah Plants, CNM No       -NIPS collected today -Anatomy scan ordered and scheduled for 06/15/21 -Intermittent headaches - Fioricet rx'd  First triemster obstetric precautions including but not limited to vaginal bleeding, contractions,  leaking of fluid and fetal movement were reviewed in detail with the patient.    Return in about 4 weeks (around 05/06/2021) for ROB.  Zipporah Plants, CNM, MSN [facility], [facility] 04/08/2021, 3:23 PM

## 2021-04-08 NOTE — Addendum Note (Signed)
Addended byZipporah Plants on: 04/08/2021 03:31 PM   Modules accepted: Orders

## 2021-04-13 LAB — MATERNIT 21 PLUS CORE, BLOOD
Fetal Fraction: 16
Result (T21): NEGATIVE
Trisomy 13 (Patau syndrome): NEGATIVE
Trisomy 18 (Edwards syndrome): NEGATIVE
Trisomy 21 (Down syndrome): NEGATIVE

## 2021-04-28 IMAGING — CT CT HEAD W/O CM
4 series · 16 of 47 positions shown, 18 images · non-contrast
Comparison: None.

CLINICAL DATA: Initial evaluation for acute trauma.

EXAM:
CT HEAD WITHOUT CONTRAST
TECHNIQUE: Contiguous axial images were obtained from the base of the skull
through the vertex without intravenous contrast.

[Series 3: head without · axial · non-contrast · 0.41mm/px · z∈[-143,-28]mm · 7 of 31 slices shown, 9 images]
[im 4/31  brain]
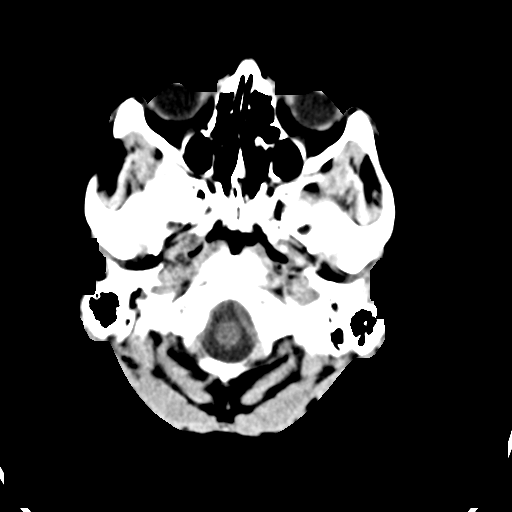
[im 4/31  bone]
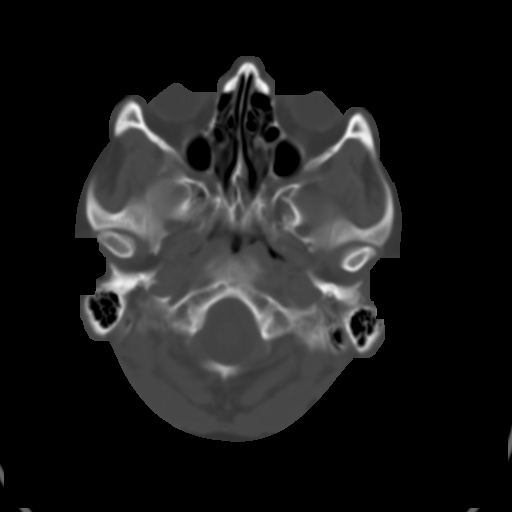
[im 8/31  brain]
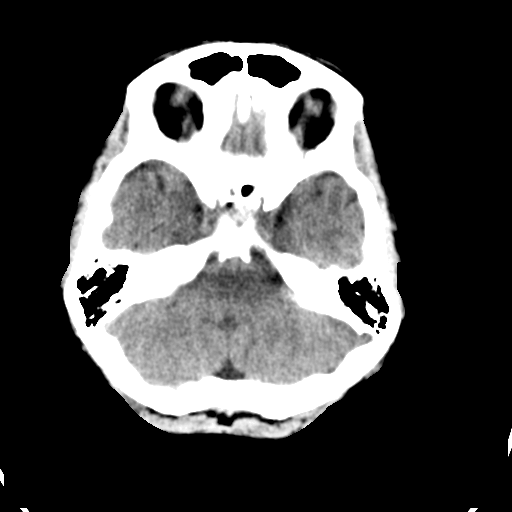
[im 12/31  brain]
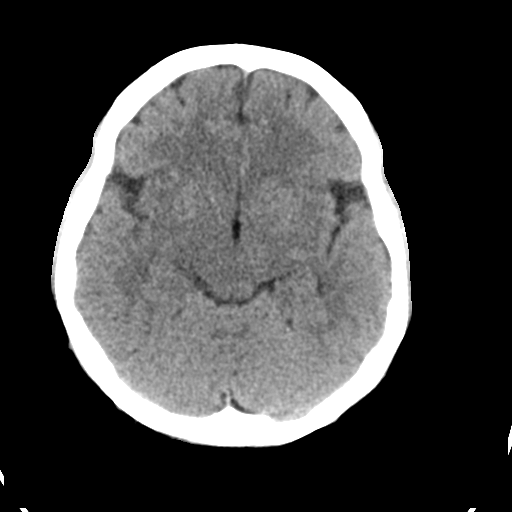
[im 16/31  brain]
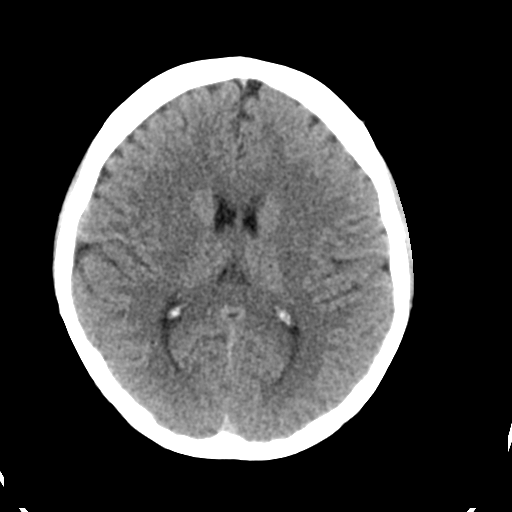
[im 19/31  brain]
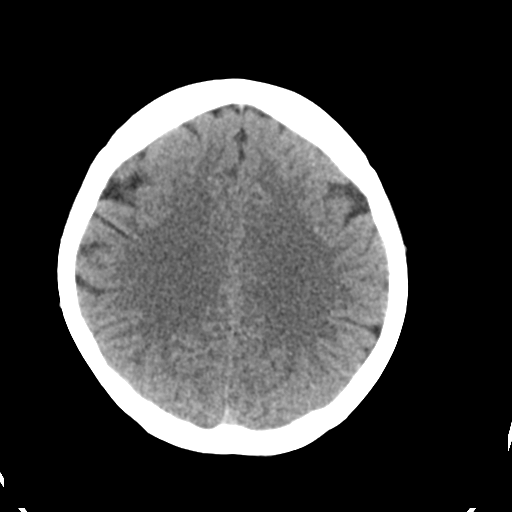
[im 19/31  bone]
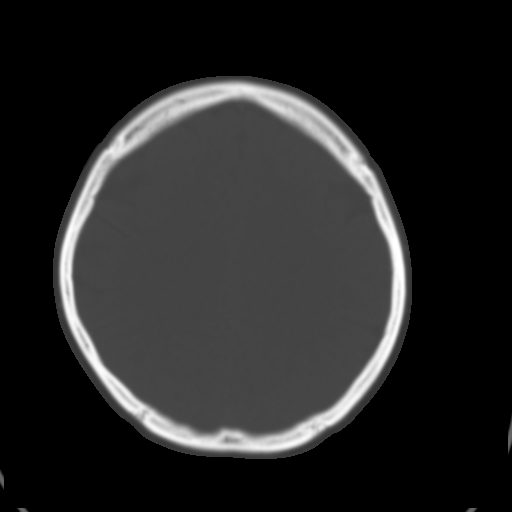
[im 23/31  brain]
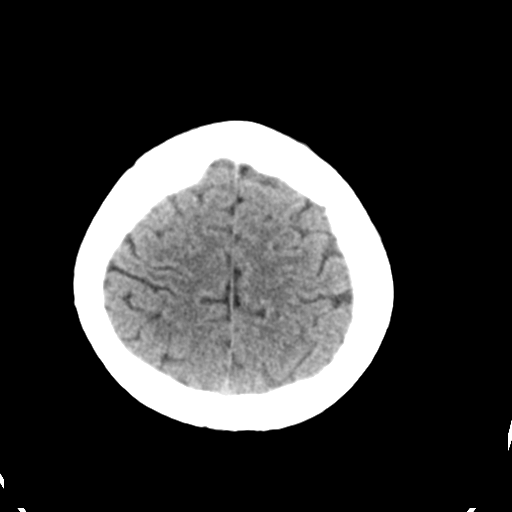
[im 27/31  brain]
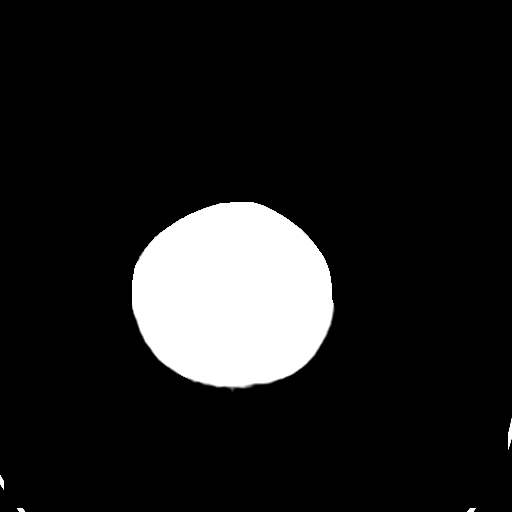

[Series 4: head bone · axial · 0.41mm/px · z∈[-144,-114]mm · 3 of 77 slices shown]
[im 8/77  bone]
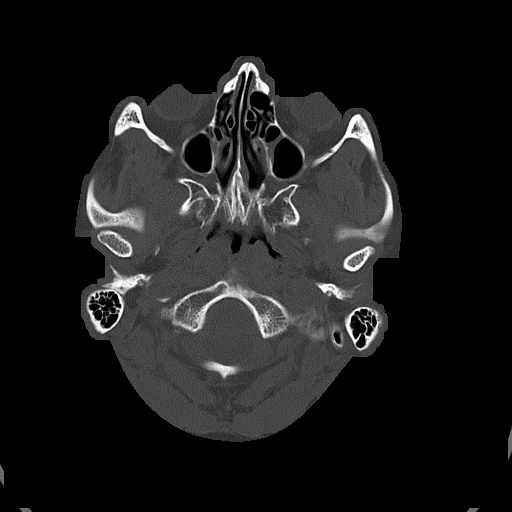
[im 16/77  bone]
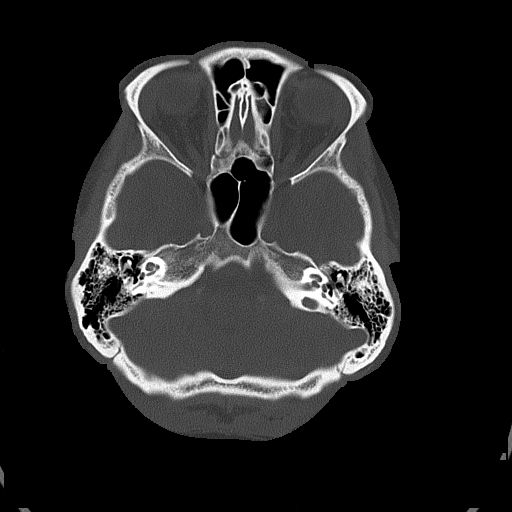
[im 23/77  bone]
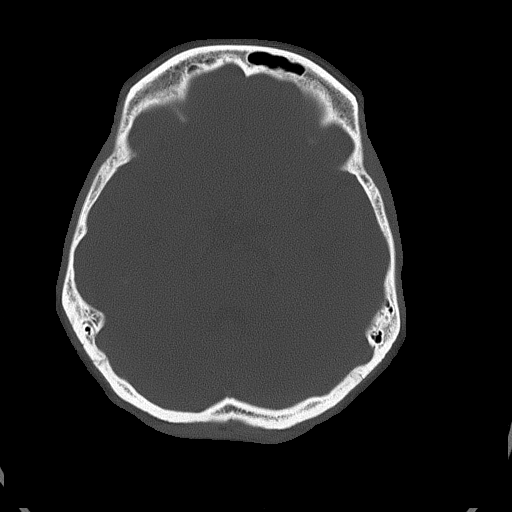

[Series 5: head without cor · coronal · non-contrast · 0.31mm/px · 3 of 67 slices shown]
[im 23/67  brain]
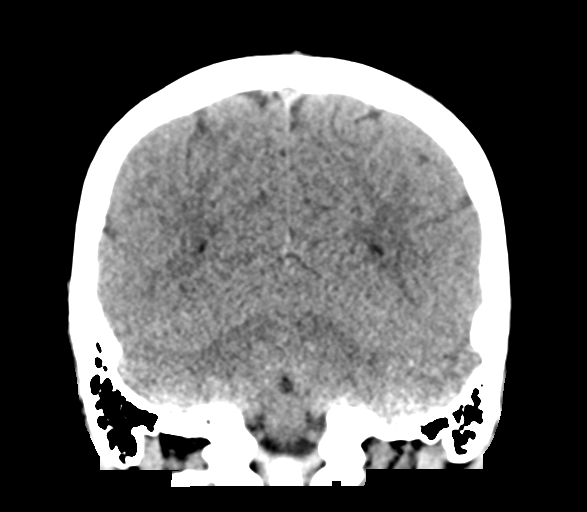
[im 30/67  brain]
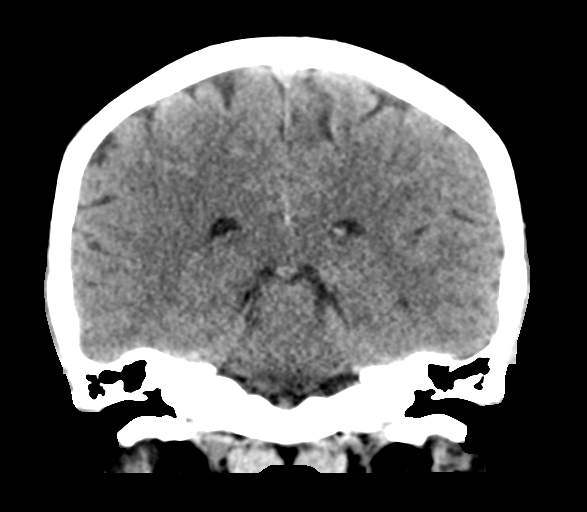
[im 37/67  brain]
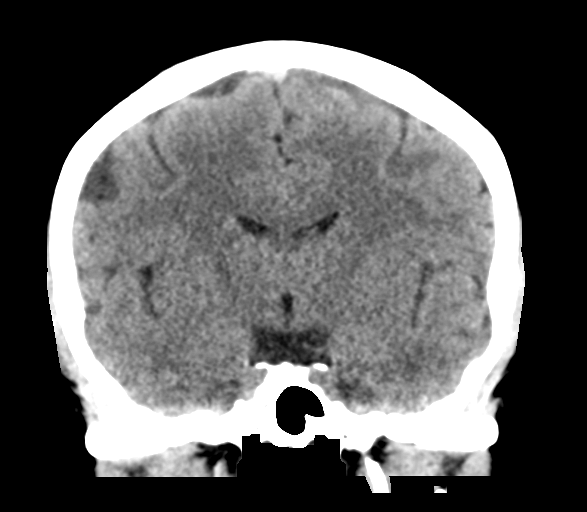

[Series 6: head without sag · sagittal · non-contrast · 0.30mm/px · 3 of 60 slices shown]
[im 20/60  brain]
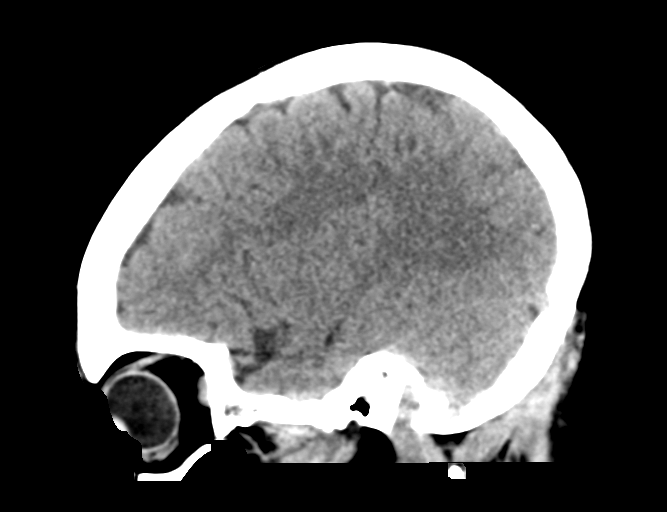
[im 30/60  brain]
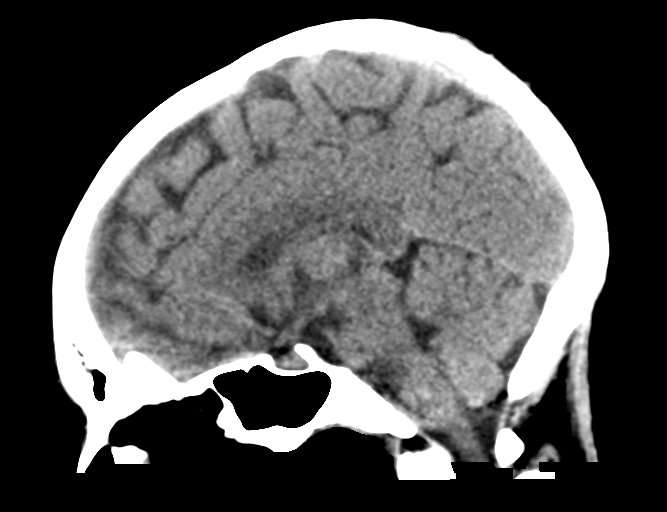
[im 40/60  brain]
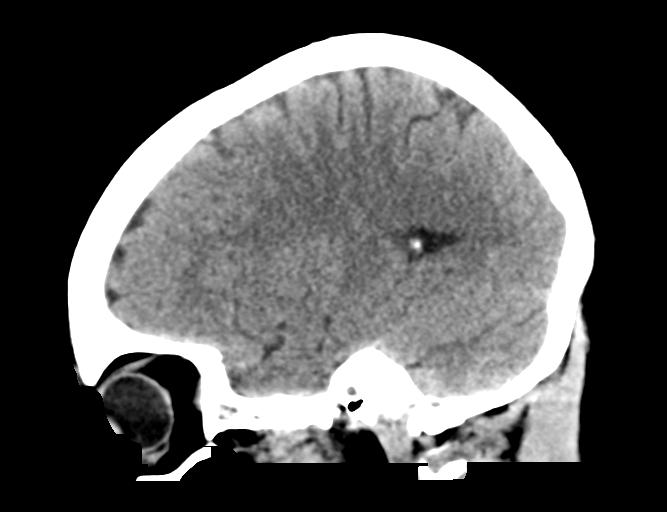

[16 of 47 positions shown; findings below may reference images not displayed]

FINDINGS: Brain: Cerebral volume within normal limits for patient age.

No evidence for acute intracranial hemorrhage. No findings to
suggest acute large vessel territory infarct. No mass lesion,
midline shift, or mass effect. Ventricles are normal in size without
evidence for hydrocephalus. No extra-axial fluid collection
identified.

Vascular: No hyperdense vessel identified.

Skull: Scalp soft tissues demonstrate no acute abnormality.
Calvarium intact.

Sinuses/Orbits: Globes and orbital soft tissues within normal
limits.

Visualized paranasal sinuses are clear. No mastoid effusion.
IMPRESSION: Negative head CT. No acute intracranial abnormality identified.

## 2021-05-06 ENCOUNTER — Other Ambulatory Visit: Payer: Self-pay

## 2021-05-06 ENCOUNTER — Ambulatory Visit (INDEPENDENT_AMBULATORY_CARE_PROVIDER_SITE_OTHER): Payer: Medicaid Other | Admitting: Obstetrics and Gynecology

## 2021-05-06 VITALS — BP 114/70 | Ht 62.0 in | Wt 134.8 lb

## 2021-05-06 DIAGNOSIS — Z98891 History of uterine scar from previous surgery: Secondary | ICD-10-CM

## 2021-05-06 DIAGNOSIS — O219 Vomiting of pregnancy, unspecified: Secondary | ICD-10-CM

## 2021-05-06 DIAGNOSIS — Z3481 Encounter for supervision of other normal pregnancy, first trimester: Secondary | ICD-10-CM

## 2021-05-06 DIAGNOSIS — O09891 Supervision of other high risk pregnancies, first trimester: Secondary | ICD-10-CM

## 2021-05-06 DIAGNOSIS — Z3A14 14 weeks gestation of pregnancy: Secondary | ICD-10-CM

## 2021-05-06 MED ORDER — METOCLOPRAMIDE HCL 10 MG PO TABS: 10.0000 mg | ORAL_TABLET | Freq: Four times a day (QID) | ORAL | 2 refills | Status: AC | PRN

## 2021-05-06 NOTE — Progress Notes (Signed)
Routine Prenatal Care Visit  Subjective  Denise Hardin is a 20 y.o. G3P2002 at [redacted]w[redacted]d being seen today for ongoing prenatal care.  She is currently monitored for the following issues for this low-risk pregnancy and has Migraine; Encounter for supervision of other normal pregnancy, first trimester; History of cesarean section; and Short interval between pregnancies affecting pregnancy in first trimester, antepartum on their problem list.  ----------------------------------------------------------------------------------- Patient reports nausea.   Contractions: Not present. Vag. Bleeding: None.  Movement: Absent. Denies leaking of fluid.  ----------------------------------------------------------------------------------- The following portions of the patient's history were reviewed and updated as appropriate: allergies, current medications, past family history, past medical history, past social history, past surgical history and problem list. Problem list updated.   Objective  Blood pressure 114/70, height 5\' 2"  (1.575 m), weight 134 lb 12.8 oz (61.1 kg), last menstrual period 12/29/2020, currently breastfeeding. Pregravid weight 130 lb (59 kg) Total Weight Gain 4 lb 12.8 oz (2.177 kg) Urinalysis:      Fetal Status: Fetal Heart Rate (bpm): 150   Movement: Absent     General:  Alert, oriented and cooperative. Patient is in no acute distress.  Skin: Skin is warm and dry. No rash noted.   Cardiovascular: Normal heart rate noted  Respiratory: Normal respiratory effort, no problems with respiration noted  Abdomen: Soft, gravid, appropriate for gestational age. Pain/Pressure: Absent     Pelvic:  Cervical exam deferred        Extremities: Normal range of motion.  Edema: None  Mental Status: Normal mood and affect. Normal behavior. Normal judgment and thought content.     Assessment   20 y.o. 02/28/2021 at [redacted]w[redacted]d by  10/29/2021, by Ultrasound presenting for routine prenatal visit  Plan    Pregnancy #3 Problems (from 03/01/21 to present)     Problem Noted Resolved   Encounter for supervision of other normal pregnancy, first trimester 03/01/2021 by 05/01/2021, CNM No   Overview Addendum 03/16/2021  8:54 AM by 03/18/2021, CNM     Nursing Staff Provider  Office Location  [facility] Dating   Lby Zipporah Plants 6 wks  Language  English Anatomy US    Flu Vaccine   Genetic Screen  NIPS:  desires  TDaP vaccine    Hgb A1C or  GTT Early : n/a Third trimester :   Rhogam  n/a   LAB RESULTS   Feeding Plan  breast Blood Type   O+  Contraception  please Antibody   Negative  Circumcision  Rubella   Non-immune  Pediatrician   RPR   Non-reactive  Support Person  mom/grandmother HBsAg   Negative  Prenatal Classes  HIV   Non-reactive    Varicella   Immune  BTL Consent   GBS (For PCN allergy, check sensitivities)        VBAC Consent  desires VBAC Pap  n/a <21 yo   Prior CS FITL, prior NSVD Hgb Electro      CF  negative     SMA  negative             History of cesarean section 03/01/2021 by 05/01/2021, CNM No   Overview Signed 03/11/2021  2:46 PM by 03/13/2021, MD    OB/GYN  Counseling Note  20 y.o. [phone] at [redacted]w[redacted]d with Estimated Date of Delivery: 10/29/21 was seen today in office to discuss trial of labor after cesarean section (TOLAC) versus elective repeat cesarean delivery (ERCD). The following risks were discussed with the patient.  Risk of uterine rupture at term is 0.78 percent with TOLAC and 0.22 percent with ERCD. 1 in 10 uterine ruptures will result in neonatal death or neurological injury. The benefits of a trial of labor after cesarean (TOLAC) resulting in a vaginal birth after cesarean (VBAC) include the following: shorter length of hospital stay and postpartum recovery (in most cases); fewer complications, such as postpartum fever, wound or uterine infection, thromboembolism (blood clots in the leg or lung), need for blood transfusion and fewer neonatal breathing  problems.  The risks of an attempted VBAC or TOLAC include the following: Risk of failed trial of labor after cesarean (TOLAC) without a vaginal birth after cesarean (VBAC) resulting in repeat cesarean delivery (RCD) in about 20 to 40 percent of women who attempt VBAC.  Risk of rupture of uterus resulting in an emergency cesarean delivery. The risk of uterine rupture may be related in part to the type of uterine incision made during the first cesarean delivery. A previous transverse uterine incision has the lowest risk of rupture (0.2 to 1.5 percent risk). Vertical or T-shaped uterine incisions have a higher risk of uterine rupture (4 to 9 percent risk)The risk of fetal death is very low with both VBAC and elective repeat cesarean delivery (ERCD), but the likelihood of fetal death is higher with VBAC than with ERCD. Maternal death is very rare with either type of delivery.  The risks of an elective repeat cesarean delivery (ERCD) were reviewed with the patient including but not limited to: 11/998 risk of uterine rupture which could have serious consequences, bleeding which may require transfusion; infection which may require antibiotics; injury to bowel, bladder or other surrounding organs (bowel, bladder, ureters); injury to the fetus; need for additional procedures including hysterectomy in the event of a life-threatening hemorrhage; thromboembolic phenomenon; abnormal placentation; incisional problems; death and other postoperative or anesthesia complications.    Desires VBAC Prior NSVD, good candidate      Short interval between pregnancies affecting pregnancy in first trimester, antepartum 03/01/2021 by Zipporah Plants, CNM No       She is having dry heaving, not vomiting. Diclegis has been some help, but not resolved. Rx for reglan sent.   Gestational age appropriate obstetric precautions including but not limited to vaginal bleeding, contractions, leaking of fluid and fetal movement were  reviewed in detail with the patient.    Return in about 4 weeks (around 06/03/2021) for ROB in person.  Natale Milch MD [facility], [facility] 05/06/2021, 4:28 PM

## 2021-06-02 ENCOUNTER — Ambulatory Visit (INDEPENDENT_AMBULATORY_CARE_PROVIDER_SITE_OTHER): Payer: Medicaid Other | Admitting: Obstetrics and Gynecology

## 2021-06-02 ENCOUNTER — Encounter: Payer: Self-pay | Admitting: Obstetrics and Gynecology

## 2021-06-02 ENCOUNTER — Other Ambulatory Visit: Payer: Self-pay

## 2021-06-02 VITALS — BP 114/70 | Ht 62.0 in | Wt 136.8 lb

## 2021-06-02 DIAGNOSIS — Z3481 Encounter for supervision of other normal pregnancy, first trimester: Secondary | ICD-10-CM

## 2021-06-02 DIAGNOSIS — Z98891 History of uterine scar from previous surgery: Secondary | ICD-10-CM

## 2021-06-02 DIAGNOSIS — Z3A18 18 weeks gestation of pregnancy: Secondary | ICD-10-CM

## 2021-06-02 DIAGNOSIS — O09891 Supervision of other high risk pregnancies, first trimester: Secondary | ICD-10-CM

## 2021-06-02 DIAGNOSIS — O09892 Supervision of other high risk pregnancies, second trimester: Secondary | ICD-10-CM

## 2021-06-02 NOTE — Progress Notes (Signed)
Routine Prenatal Care Visit  Subjective  Denise Hardin is a 20 y.o. G3P2002 at [redacted]w[redacted]d being seen today for ongoing prenatal care.  She is currently monitored for the following issues for this low-risk pregnancy and has Migraine; Encounter for supervision of other normal pregnancy, first trimester; History of cesarean section; and Short interval between pregnancies affecting pregnancy in first trimester, antepartum on their problem list.  ----------------------------------------------------------------------------------- Patient reports no complaints.   Contractions: Not present. Vag. Bleeding: None.  Movement: Present. Leaking Fluid denies.  ----------------------------------------------------------------------------------- The following portions of the patient's history were reviewed and updated as appropriate: allergies, current medications, past family history, past medical history, past social history, past surgical history and problem list. Problem list updated.  Objective  Blood pressure 114/70, height 5\' 2"  (1.575 m), weight 136 lb 12.8 oz (62.1 kg), last menstrual period 12/29/2020, currently breastfeeding. Pregravid weight 130 lb (59 kg) Total Weight Gain 6 lb 12.8 oz (3.084 kg) Urinalysis: Urine Protein    Urine Glucose    Fetal Status: Fetal Heart Rate (bpm): 140   Movement: Present     General:  Alert, oriented and cooperative. Patient is in no acute distress.  Skin: Skin is warm and dry. No rash noted.   Cardiovascular: Normal heart rate noted  Respiratory: Normal respiratory effort, no problems with respiration noted  Abdomen: Soft, gravid, appropriate for gestational age. Pain/Pressure: Absent     Pelvic:  Cervical exam deferred        Extremities: Normal range of motion.     Mental Status: Normal mood and affect. Normal behavior. Normal judgment and thought content.   Assessment   20 y.o. 02/28/2021 at [redacted]w[redacted]d by  10/29/2021, by Ultrasound presenting for routine prenatal  visit  Plan   Pregnancy #3 Problems (from 03/01/21 to present)     Problem Noted Resolved   Encounter for supervision of other normal pregnancy, first trimester 03/01/2021 by 05/01/2021, CNM No   Overview Addendum 03/16/2021  8:54 AM by 03/18/2021, CNM     Nursing Staff Provider  Office Location  [facility] Dating   Lby Denise Hardin 6 wks  Language  English Anatomy US    Flu Vaccine   Genetic Screen  NIPS:  desires  TDaP vaccine    Hgb A1C or  GTT Early : n/a Third trimester :   Rhogam  n/a   LAB RESULTS   Feeding Plan  breast Blood Type   O+  Contraception  please Antibody   Negative  Circumcision  Rubella   Non-immune  Pediatrician   RPR   Non-reactive  Support Person  mom/grandmother HBsAg   Negative  Prenatal Classes  HIV   Non-reactive    Varicella   Immune  BTL Consent   GBS (For PCN allergy, check sensitivities)        VBAC Consent  desires VBAC Pap  n/a <21 yo   Prior CS FITL, prior NSVD Hgb Electro      CF  negative     SMA  negative             History of cesarean section 03/01/2021 by 05/01/2021, CNM No   Overview Signed 03/11/2021  2:46 PM by 03/13/2021, MD    OB/GYN  Counseling Note  20 y.o. [phone] at [redacted]w[redacted]d with Estimated Date of Delivery: 10/29/21 was seen today in office to discuss trial of labor after cesarean section (TOLAC) versus elective repeat cesarean delivery (ERCD). The following risks were discussed with the patient.  Risk of uterine rupture at term is 0.78 percent with TOLAC and 0.22 percent with ERCD. 1 in 10 uterine ruptures will result in neonatal death or neurological injury. The benefits of a trial of labor after cesarean (TOLAC) resulting in a vaginal birth after cesarean (VBAC) include the following: shorter length of hospital stay and postpartum recovery (in most cases); fewer complications, such as postpartum fever, wound or uterine infection, thromboembolism (blood clots in the leg or lung), need for blood transfusion and fewer neonatal  breathing problems.  The risks of an attempted VBAC or TOLAC include the following: Risk of failed trial of labor after cesarean (TOLAC) without a vaginal birth after cesarean (VBAC) resulting in repeat cesarean delivery (RCD) in about 20 to 40 percent of women who attempt VBAC.  Risk of rupture of uterus resulting in an emergency cesarean delivery. The risk of uterine rupture may be related in part to the type of uterine incision made during the first cesarean delivery. A previous transverse uterine incision has the lowest risk of rupture (0.2 to 1.5 percent risk). Vertical or T-shaped uterine incisions have a higher risk of uterine rupture (4 to 9 percent risk)The risk of fetal death is very low with both VBAC and elective repeat cesarean delivery (ERCD), but the likelihood of fetal death is higher with VBAC than with ERCD. Maternal death is very rare with either type of delivery.  The risks of an elective repeat cesarean delivery (ERCD) were reviewed with the patient including but not limited to: 11/998 risk of uterine rupture which could have serious consequences, bleeding which may require transfusion; infection which may require antibiotics; injury to bowel, bladder or other surrounding organs (bowel, bladder, ureters); injury to the fetus; need for additional procedures including hysterectomy in the event of a life-threatening hemorrhage; thromboembolic phenomenon; abnormal placentation; incisional problems; death and other postoperative or anesthesia complications.    Desires VBAC Prior NSVD, good candidate      Short interval between pregnancies affecting pregnancy in first trimester, antepartum 03/01/2021 by Denise Hardin, CNM No        Preterm labor symptoms and general obstetric precautions including but not limited to vaginal bleeding, contractions, leaking of fluid and fetal movement were reviewed in detail with the patient. Please refer to After Visit Summary for other counseling  recommendations.   Anatomy u/s scheduled for 8/23, f/u after already scheduled   Return for Keep previously scheduled appts.   Denise Mohair, MD, [facility], [facility] 06/02/2021 3:19 PM

## 2021-06-15 ENCOUNTER — Ambulatory Visit
Admission: RE | Admit: 2021-06-15 | Discharge: 2021-06-15 | Disposition: A | Discharge status: Home / Self Care | Payer: Medicaid Other | Source: Ambulatory Visit | Attending: Obstetrics & Gynecology | Admitting: Obstetrics & Gynecology

## 2021-06-15 ENCOUNTER — Other Ambulatory Visit: Payer: Self-pay

## 2021-06-15 DIAGNOSIS — Z3689 Encounter for other specified antenatal screening: Secondary | ICD-10-CM | POA: Diagnosis present

## 2021-06-16 ENCOUNTER — Ambulatory Visit (INDEPENDENT_AMBULATORY_CARE_PROVIDER_SITE_OTHER): Payer: Medicaid Other | Admitting: Obstetrics & Gynecology

## 2021-06-16 ENCOUNTER — Encounter: Payer: Self-pay | Admitting: Obstetrics & Gynecology

## 2021-06-16 VITALS — BP 100/60 | Wt 138.0 lb

## 2021-06-16 DIAGNOSIS — Z3482 Encounter for supervision of other normal pregnancy, second trimester: Secondary | ICD-10-CM

## 2021-06-16 DIAGNOSIS — Z3A20 20 weeks gestation of pregnancy: Secondary | ICD-10-CM

## 2021-06-16 DIAGNOSIS — Z131 Encounter for screening for diabetes mellitus: Secondary | ICD-10-CM

## 2021-06-16 DIAGNOSIS — Z98891 History of uterine scar from previous surgery: Secondary | ICD-10-CM

## 2021-06-16 DIAGNOSIS — Z23 Encounter for immunization: Secondary | ICD-10-CM

## 2021-06-16 DIAGNOSIS — O09892 Supervision of other high risk pregnancies, second trimester: Secondary | ICD-10-CM

## 2021-06-16 NOTE — Patient Instructions (Signed)

## 2021-06-16 NOTE — Progress Notes (Signed)
Subjective  Fetal Movement? yes Contractions? no Leaking Fluid? no Vaginal Bleeding? no  Objective  BP 100/60   Wt 138 lb (62.6 kg)   LMP 12/29/2020 (Approximate)   BMI 25.24 kg/m  General: NAD Pumonary: no increased work of breathing Abdomen: gravid, non-tender Extremities: no edema Psychiatric: mood appropriate, affect full  Assessment  20 y.o. [id number] at [redacted]w[redacted]d by  10/29/2021, by Ultrasound presenting for routine prenatal visit  Plan   Problem List Items Addressed This Visit      Other   Encounter for supervision of other normal pregnancy, first trimester   History of cesarean section   Short interval between pregnancies affecting pregnancy in first trimester, antepartum  Other Visit Diagnoses    [redacted] weeks gestation of pregnancy    -  Primary   Screening for diabetes mellitus       Relevant Orders   28 Week RH+Panel    [place] discussed CS vs VBAC discussed, pt considering CS at this time PNV Glucola 2 mos Flu shot today Planning for Nexplanon  Pregnancy #3 Problems (from 03/01/21 to present)    Problem Noted Resolved   Encounter for supervision of other normal pregnancy, first trimester 03/01/2021 by Zipporah Plants, CNM No   Overview Addendum 06/16/2021  1:57 PM by Nadara Mustard, MD     Nursing Staff Provider  Office Location  [facility] Dating   Lby [place] 6 wks  Language  English Anatomy US   [place], [place]  Flu Vaccine  06/16/21 Genetic Screen  NIPS:  nml XX  TDaP vaccine    Hgb A1C or  GTT Early : n/a Third trimester :   Rhogam  n/a   LAB RESULTS   Feeding Plan  breast Blood Type   O+  Contraception  please Antibody   Negative  Circumcision n/a Rubella   Non-immune  Pediatrician   RPR   Non-reactive  Support Person  mom/grandmother HBsAg   Negative  Prenatal Classes  HIV   Non-reactive    Varicella   Immune  BTL Consent  no GBS (For PCN allergy, check sensitivities)        VBAC Consent Considering VBAC Pap  n/a <21 yo   Prior CS FITL, prior NSVD       CF   negative     SMA  negative              History of cesarean section 03/01/2021 by Zipporah Plants, CNM No   Overview Signed 03/11/2021  2:46 PM by Nadara Mustard, MD    OB/GYN  Counseling Note  20 y.o. [phone] at [redacted]w[redacted]d with Estimated Date of Delivery: 10/29/21 was seen today in office to discuss trial of labor after cesarean section (TOLAC) versus elective repeat cesarean delivery (ERCD). The following risks were discussed with the patient.  Risk of uterine rupture at term is 0.78 percent with TOLAC and 0.22 percent with ERCD. 1 in 10 uterine ruptures will result in neonatal death or neurological injury. The benefits of a trial of labor after cesarean (TOLAC) resulting in a vaginal birth after cesarean (VBAC) include the following: shorter length of hospital stay and postpartum recovery (in most cases); fewer complications, such as postpartum fever, wound or uterine infection, thromboembolism (blood clots in the leg or lung), need for blood transfusion and fewer neonatal breathing problems.  The risks of an attempted VBAC or TOLAC include the following: Risk of failed trial of labor after cesarean (TOLAC) without a vaginal birth after cesarean (VBAC)  resulting in repeat cesarean delivery (RCD) in about 20 to 40 percent of women who attempt VBAC.  Risk of rupture of uterus resulting in an emergency cesarean delivery. The risk of uterine rupture may be related in part to the type of uterine incision made during the first cesarean delivery. A previous transverse uterine incision has the lowest risk of rupture (0.2 to 1.5 percent risk). Vertical or T-shaped uterine incisions have a higher risk of uterine rupture (4 to 9 percent risk)The risk of fetal death is very low with both VBAC and elective repeat cesarean delivery (ERCD), but the likelihood of fetal death is higher with VBAC than with ERCD. Maternal death is very rare with either type of delivery.  The risks of an elective repeat cesarean delivery  (ERCD) were reviewed with the patient including but not limited to: 11/998 risk of uterine rupture which could have serious consequences, bleeding which may require transfusion; infection which may require antibiotics; injury to bowel, bladder or other surrounding organs (bowel, bladder, ureters); injury to the fetus; need for additional procedures including hysterectomy in the event of a life-threatening hemorrhage; thromboembolic phenomenon; abnormal placentation; incisional problems; death and other postoperative or anesthesia complications.    Desires VBAC Prior NSVD, good candidate      Short interval between pregnancies affecting pregnancy in first trimester, antepartum 03/01/2021 by Zipporah Plants, CNM No       [name], MD, [facility], [facility] 06/16/2021  2:09 PM

## 2021-07-14 ENCOUNTER — Encounter: Payer: Medicaid Other | Admitting: Obstetrics and Gynecology

## 2021-08-11 ENCOUNTER — Encounter: Payer: Self-pay | Admitting: Obstetrics & Gynecology

## 2021-08-11 ENCOUNTER — Other Ambulatory Visit: Payer: Medicaid Other

## 2021-08-11 ENCOUNTER — Ambulatory Visit (INDEPENDENT_AMBULATORY_CARE_PROVIDER_SITE_OTHER): Payer: Medicaid Other | Admitting: Obstetrics & Gynecology

## 2021-08-11 ENCOUNTER — Other Ambulatory Visit: Payer: Self-pay

## 2021-08-11 VITALS — BP 104/64 | Wt 152.0 lb

## 2021-08-11 DIAGNOSIS — Z131 Encounter for screening for diabetes mellitus: Secondary | ICD-10-CM

## 2021-08-11 DIAGNOSIS — Z98891 History of uterine scar from previous surgery: Secondary | ICD-10-CM

## 2021-08-11 DIAGNOSIS — Z3A28 28 weeks gestation of pregnancy: Secondary | ICD-10-CM

## 2021-08-11 DIAGNOSIS — Z3483 Encounter for supervision of other normal pregnancy, third trimester: Secondary | ICD-10-CM

## 2021-08-11 NOTE — Patient Instructions (Signed)
Recommendations to boost your immunity to prevent illness such as viral flu and colds, including covid19, are as follows:       - - -  Vitamin K2 and Vitamin D3  - - - Take Vitamin K2 at 200-300 mcg daily (usually 2-3 pills daily of the over the counter formulation). Take Vitamin D3 at 3000-4000 U daily (usually 3-4 pills daily of the over the counter formulation). Studies show that these two at high normal levels in your system are very effective in keeping your immunity so strong and protective that you will be unlikely to contract viral illness such as those listed above.  Dr Tiburcio Pea  Third Trimester of Pregnancy The third trimester of pregnancy is from week 28 through week 40. This is months 7 through 9. The third trimester is a time when the unborn baby (fetus) is growing rapidly. At the end of the ninth month, the fetus is about 20 inches long and weighs 6-10 pounds. Body changes during your third trimester During the third trimester, your body will continue to go through many changes. The changes vary and generally return to normal after your baby is born. Physical changes Your weight will continue to increase. You can expect to gain 25-35 pounds (11-16 kg) by the end of the pregnancy if you begin pregnancy at a normal weight. If you are underweight, you can expect to gain 28-40 lb (about 13-18 kg), and if you are overweight, you can expect to gain 15-25 lb (about 7-11 kg). You may begin to get stretch marks on your hips, abdomen, and breasts. Your breasts will continue to grow and may hurt. A yellow fluid (colostrum) may leak from your breasts. This is the first milk you are producing for your baby. You may have changes in your hair. These can include thickening of your hair, rapid growth, and changes in texture. Some people also have hair loss during or after pregnancy, or hair that feels dry or thin. Your belly button may stick out. You may notice more swelling in your hands, face, or  ankles. Health changes You may have heartburn. You may have constipation. You may develop hemorrhoids. You may develop swollen, bulging veins (varicose veins) in your legs. You may have increased body aches in the pelvis, back, or thighs. This is due to weight gain and increased hormones that are relaxing your joints. You may have increased tingling or numbness in your hands, arms, and legs. The skin on your abdomen may also feel numb. You may feel short of breath because of your expanding uterus. Other changes You may urinate more often because the fetus is moving lower into your pelvis and pressing on your bladder. You may have more problems sleeping. This may be caused by the size of your abdomen, an increased need to urinate, and an increase in your body's metabolism. You may notice the fetus "dropping," or moving lower in your abdomen (lightening). You may have increased vaginal discharge. You may notice that you have pain around your pelvic bone as your uterus distends. Follow these instructions at home: Medicines Follow your health care provider's instructions regarding medicine use. Specific medicines may be either safe or unsafe to take during pregnancy. Do not take any medicines unless approved by your health care provider. Take a prenatal vitamin that contains at least 600 micrograms (mcg) of folic acid. Eating and drinking Eat a healthy diet that includes fresh fruits and vegetables, whole grains, good sources of protein such as meat, eggs, or  tofu, and low-fat dairy products. Avoid raw meat and unpasteurized juice, milk, and cheese. These carry germs that can harm you and your baby. Eat 4 or 5 small meals rather than 3 large meals a day. You may need to take these actions to prevent or treat constipation: Drink enough fluid to keep your urine pale yellow. Eat foods that are high in fiber, such as beans, whole grains, and fresh fruits and vegetables. Limit foods that are high in  fat and processed sugars, such as fried or sweet foods. Activity Exercise only as directed by your health care provider. Most people can continue their usual exercise routine during pregnancy. Try to exercise for 30 minutes at least 5 days a week. Stop exercising if you experience contractions in the uterus. Stop exercising if you develop pain or cramping in the lower abdomen or lower back. Avoid heavy lifting. Do not exercise if it is very hot or humid or if you are at a high altitude. If you choose to, you may continue to have sex unless your health care provider tells you not to. Relieving pain and discomfort Take frequent breaks and rest with your legs raised (elevated) if you have leg cramps or low back pain. Take warm sitz baths to soothe any pain or discomfort caused by hemorrhoids. Use hemorrhoid cream if your health care provider approves. Wear a supportive bra to prevent discomfort from breast tenderness. If you develop varicose veins: Wear support hose as told by your health care provider. Elevate your feet for 15 minutes, 3-4 times a day. Limit salt in your diet. Safety Talk to your health care provider before traveling far distances. Do not use hot tubs, steam rooms, or saunas. Wear your seat belt at all times when driving or riding in a car. Talk with your health care provider if someone is verbally or physically abusive to you. Preparing for birth To prepare for the arrival of your baby: Take prenatal classes to understand, practice, and ask questions about labor and delivery. Visit the hospital and tour the maternity area. Purchase a rear-facing car seat and make sure you know how to install it in your car. Prepare the baby's room or sleeping area. Make sure to remove all pillows and stuffed animals from the baby's crib to prevent suffocation. General instructions Avoid cat litter boxes and soil used by cats. These carry germs that can cause birth defects in the baby. If  you have a cat, ask someone to clean the litter box for you. Do not douche or use tampons. Do not use scented sanitary pads. Do not use any products that contain nicotine or tobacco, such as cigarettes, e-cigarettes, and chewing tobacco. If you need help quitting, ask your health care provider. Do not use any herbal remedies, illegal drugs, or medicines that were not prescribed to you. Chemicals in these products can harm your baby. Do not drink alcohol. You will have more frequent prenatal exams during the third trimester. During a routine prenatal visit, your health care provider will do a physical exam, perform tests, and discuss your overall health. Keep all follow-up visits. This is important. Where to find more information American Pregnancy Association: americanpregnancy.org [employer]: https://www.todd-brady.net/ Office on [employer] Health: MightyReward.[employer].nz Contact a health care provider if you have: A fever. Mild pelvic cramps, pelvic pressure, or nagging pain in your abdominal area or lower back. Vomiting or diarrhea. Bad-smelling vaginal discharge or foul-smelling urine. Pain when you urinate. A headache that does  not go away when you take medicine. Visual changes or see spots in front of your eyes. Get help right away if: Your water breaks. You have regular contractions less than 5 minutes apart. You have spotting or bleeding from your vagina. You have severe abdominal pain. You have difficulty breathing. You have chest pain. You have fainting spells. You have not felt your baby move for the time period told by your health care provider. You have new or increased pain, swelling, or redness in an arm or leg. Summary The third trimester of pregnancy is from week 28 through week 40 (months 7 through 9). You may have more problems sleeping. This can be caused by the size of your abdomen, an increased need to urinate, and  an increase in your body's metabolism. You will have more frequent prenatal exams during the third trimester. Keep all follow-up visits. This is important. This information is not intended to replace advice given to you by your health care provider. Make sure you discuss any questions you have with your health care provider. Document Revised: 03/18/2020 Document Reviewed: 01/23/2020 Elsevier Patient Education  2022 [employer].

## 2021-08-11 NOTE — Progress Notes (Signed)
Subjective  Fetal Movement? yes Contractions? no Leaking Fluid? no Vaginal Bleeding? no  Objective  BP 104/64   Wt 152 lb (68.9 kg)   LMP 12/29/2020 (Approximate)   BMI 27.80 kg/m  General: NAD Pumonary: no increased work of breathing Abdomen: gravid, non-tender Extremities: no edema Psychiatric: mood appropriate, affect full  Assessment  20 y.o. [id number] at [redacted]w[redacted]d by  10/29/2021, by Ultrasound presenting for routine prenatal visit  Plan   Problem List Items Addressed This Visit       Other   Encounter for supervision of other normal pregnancy, first trimester - Primary   History of cesarean section   Other Visit Diagnoses     [redacted] weeks gestation of pregnancy         Glucola today PNV, FMC IUD for birth control may be better than Nexplanon as has had prior BTB on Nexplanon to the point of early removal Plans to bottle feed now.  Discussed benefits of breast feeding  Pregnancy #3 Problems (from 03/01/21 to present)     Problem Noted Resolved   Encounter for supervision of other normal pregnancy, first trimester 03/01/2021 by Zipporah Plants, CNM No   Overview Addendum 06/16/2021  2:11 PM by Nadara Mustard, MD     Nursing Staff Provider  Office Location  [facility] Dating   Lby [place] 6 wks  Language  English Anatomy US   [place], [place]  Flu Vaccine  06/16/21 Genetic Screen  NIPS:  nml XX  TDaP vaccine    Hgb A1C or  GTT Early : n/a Third trimester :   Rhogam  n/a   LAB RESULTS   Feeding Plan  bottle Blood Type   O+  Contraception  IUD Antibody   Negative  Circumcision n/a Rubella   Non-immune  Pediatrician   RPR   Non-reactive  Support Person  mom/grandmother HBsAg   Negative  Prenatal Classes  HIV   Non-reactive    Varicella   Immune  BTL Consent  no GBS (For PCN allergy, check sensitivities)        VBAC Consent  desires VBAC Pap  n/a <21 yo   Prior CS FITL, prior NSVD Hgb Electro      CF  negative     SMA  negative             History of cesarean section      Overview Signed 03/11/2021  2:46 PM by Nadara Mustard, MD    OB/GYN  Counseling Note  20 y.o. [phone] at [redacted]w[redacted]d with Estimated Date of Delivery: 10/29/21 was seen today in office to discuss trial of labor after cesarean section (TOLAC) versus elective repeat cesarean delivery (ERCD). The following risks were discussed with the patient.  Risk of uterine rupture at term is 0.78 percent with TOLAC and 0.22 percent with ERCD. 1 in 10 uterine ruptures will result in neonatal death or neurological injury. The benefits of a trial of labor after cesarean (TOLAC) resulting in a vaginal birth after cesarean (VBAC) include the following: shorter length of hospital stay and postpartum recovery (in most cases); fewer complications, such as postpartum fever, wound or uterine infection, thromboembolism (blood clots in the leg or lung), need for blood transfusion and fewer neonatal breathing problems.  The risks of an attempted VBAC or TOLAC include the following: Risk of failed trial of labor after cesarean (TOLAC) without a vaginal birth after cesarean (VBAC) resulting in repeat cesarean delivery (RCD) in about 20 to 40 percent of women  who attempt VBAC.  Risk of rupture of uterus resulting in an emergency cesarean delivery. The risk of uterine rupture may be related in part to the type of uterine incision made during the first cesarean delivery. A previous transverse uterine incision has the lowest risk of rupture (0.2 to 1.5 percent risk). Vertical or T-shaped uterine incisions have a higher risk of uterine rupture (4 to 9 percent risk)The risk of fetal death is very low with both VBAC and elective repeat cesarean delivery (ERCD), but the likelihood of fetal death is higher with VBAC than with ERCD. Maternal death is very rare with either type of delivery.  The risks of an elective repeat cesarean delivery (ERCD) were reviewed with the patient including but not limited to: 11/998 risk of uterine rupture which could  have serious consequences, bleeding which may require transfusion; infection which may require antibiotics; injury to bowel, bladder or other surrounding organs (bowel, bladder, ureters); injury to the fetus; need for additional procedures including hysterectomy in the event of a life-threatening hemorrhage; thromboembolic phenomenon; abnormal placentation; incisional problems; death and other postoperative or anesthesia complications.    Desires VBAC Prior NSVD, good candidate      Short interval between pregnancies affecting pregnancy in first trimester, antepartum          Denise Major, MD, [facility], [facility] 08/11/2021  3:12 PM

## 2021-08-12 LAB — 28 WEEK RH+PANEL
Basophils Absolute: 0 10*3/uL (ref 0.0–0.2)
Basos: 0 %
EOS (ABSOLUTE): 0 10*3/uL (ref 0.0–0.4)
Eos: 0 %
Gestational Diabetes Screen: 103 mg/dL (ref 70–139)
HIV Screen 4th Generation wRfx: NONREACTIVE
Hematocrit: 36.4 % (ref 34.0–46.6)
Hemoglobin: 12.5 g/dL (ref 11.1–15.9)
Immature Grans (Abs): 0 10*3/uL (ref 0.0–0.1)
Immature Granulocytes: 0 %
Lymphocytes Absolute: 2.4 10*3/uL (ref 0.7–3.1)
Lymphs: 20 %
MCH: 28.9 pg (ref 26.6–33.0)
MCHC: 34.3 g/dL (ref 31.5–35.7)
MCV: 84 fL (ref 79–97)
Monocytes Absolute: 0.6 10*3/uL (ref 0.1–0.9)
Monocytes: 5 %
Neutrophils Absolute: 8.8 10*3/uL — ABNORMAL HIGH (ref 1.4–7.0)
Neutrophils: 75 %
Platelets: 242 10*3/uL (ref 150–450)
RBC: 4.33 x10E6/uL (ref 3.77–5.28)
RDW: 11.8 % (ref 11.7–15.4)
RPR Ser Ql: NONREACTIVE
WBC: 11.9 10*3/uL — ABNORMAL HIGH (ref 3.4–10.8)

## 2021-08-17 NOTE — Telephone Encounter (Signed)
No show appointment  

## 2021-08-25 ENCOUNTER — Encounter: Payer: Medicaid Other | Admitting: Obstetrics and Gynecology

## 2021-08-26 ENCOUNTER — Encounter: Payer: Medicaid Other | Admitting: Obstetrics and Gynecology

## 2021-09-08 ENCOUNTER — Other Ambulatory Visit: Payer: Self-pay

## 2021-09-08 ENCOUNTER — Ambulatory Visit (INDEPENDENT_AMBULATORY_CARE_PROVIDER_SITE_OTHER): Payer: Medicaid Other | Admitting: Advanced Practice Midwife

## 2021-09-08 ENCOUNTER — Encounter: Payer: Self-pay | Admitting: Advanced Practice Midwife

## 2021-09-08 VITALS — BP 104/68 | Wt 159.0 lb

## 2021-09-08 DIAGNOSIS — Z23 Encounter for immunization: Secondary | ICD-10-CM

## 2021-09-08 DIAGNOSIS — Z3483 Encounter for supervision of other normal pregnancy, third trimester: Secondary | ICD-10-CM

## 2021-09-08 DIAGNOSIS — Z3A32 32 weeks gestation of pregnancy: Secondary | ICD-10-CM

## 2021-09-08 NOTE — Progress Notes (Signed)
Routine Prenatal Care Visit  Subjective  Denise Hardin is a 20 y.o. G3P2002 at 103w5d being seen today for ongoing prenatal care.  She is currently monitored for the following issues for this low-risk pregnancy and has Migraine; Supervision of other normal pregnancy, antepartum; History of cesarean section; and Short interval between pregnancies affecting pregnancy in first trimester, antepartum on their problem list.  ----------------------------------------------------------------------------------- Patient reports no complaints. She is now thinking she may want a repeat c/s. She wants to consider until next visit.  Contractions: Not present. Vag. Bleeding: None.  Movement: Present. Leaking Fluid denies.  ----------------------------------------------------------------------------------- The following portions of the patient's history were reviewed and updated as appropriate: allergies, current medications, past family history, past medical history, past social history, past surgical history and problem list. Problem list updated.  Objective  Blood pressure 104/68, weight 159 lb (72.1 kg), last menstrual period 12/29/2020 Pregravid weight 130 lb (59 kg) Total Weight Gain 29 lb (13.2 kg) Urinalysis: Urine Protein    Urine Glucose    Fetal Status: Fetal Heart Rate (bpm): 125 Fundal Height: 32 cm Movement: Present     General:  Alert, oriented and cooperative. Patient is in no acute distress.  Skin: Skin is warm and dry. No rash noted.   Cardiovascular: Normal heart rate noted  Respiratory: Normal respiratory effort, no problems with respiration noted  Abdomen: Soft, gravid, appropriate for gestational age. Pain/Pressure: Absent     Pelvic:  Cervical exam deferred        Extremities: Normal range of motion.  Edema: None  Mental Status: Normal mood and affect. Normal behavior. Normal judgment and thought content.   Assessment   20 y.o. [id number] at [redacted]w[redacted]d by  10/29/2021, by Ultrasound  presenting for routine prenatal visit  Plan   Pregnancy #3 Problems (from 03/01/21 to present)    Problem Noted Resolved   Supervision of other normal pregnancy, antepartum 03/01/2021 by Zipporah Plants, CNM No   Overview Addendum 08/11/2021  3:14 PM by Nadara Mustard, MD     Nursing Staff Provider  Office Location  [facility] Dating   Lby [place] 6 wks  Language  English Anatomy US   [place], [place]  Flu Vaccine  06/16/21 Genetic Screen  NIPS:  nml XX  TDaP vaccine    Hgb A1C or  GTT Early : n/a Third trimester :   Rhogam  n/a   LAB RESULTS   Feeding Plan  bottle Blood Type   O+  Contraception  IUD Antibody   Negative  Circumcision n/a Rubella   Non-immune  Pediatrician   RPR   Non-reactive  Support Person  mom/grandmother HBsAg   Negative  Prenatal Classes  HIV   Non-reactive    Varicella   Immune  BTL Consent  no GBS (For PCN allergy, check sensitivities)        VBAC Consent  desires VBAC Pap  n/a <21 yo   Prior CS FITL, prior NSVD Hgb Electro      CF  negative     SMA  negative              History of cesarean section 03/01/2021 by Zipporah Plants, CNM No   Overview Signed 03/11/2021  2:46 PM by Nadara Mustard, MD    OB/GYN  Counseling Note  20 y.o. [phone] at [redacted]w[redacted]d with Estimated Date of Delivery: 10/29/21 was seen today in office to discuss trial of labor after cesarean section (TOLAC) versus elective repeat cesarean delivery (ERCD). The following risks  were discussed with the patient.  Risk of uterine rupture at term is 0.78 percent with TOLAC and 0.22 percent with ERCD. 1 in 10 uterine ruptures will result in neonatal death or neurological injury. The benefits of a trial of labor after cesarean (TOLAC) resulting in a vaginal birth after cesarean (VBAC) include the following: shorter length of hospital stay and postpartum recovery (in most cases); fewer complications, such as postpartum fever, wound or uterine infection, thromboembolism (blood clots in the leg or lung), need for blood  transfusion and fewer neonatal breathing problems.  The risks of an attempted VBAC or TOLAC include the following: Risk of failed trial of labor after cesarean (TOLAC) without a vaginal birth after cesarean (VBAC) resulting in repeat cesarean delivery (RCD) in about 20 to 40 percent of women who attempt VBAC.  Risk of rupture of uterus resulting in an emergency cesarean delivery. The risk of uterine rupture may be related in part to the type of uterine incision made during the first cesarean delivery. A previous transverse uterine incision has the lowest risk of rupture (0.2 to 1.5 percent risk). Vertical or T-shaped uterine incisions have a higher risk of uterine rupture (4 to 9 percent risk)The risk of fetal death is very low with both VBAC and elective repeat cesarean delivery (ERCD), but the likelihood of fetal death is higher with VBAC than with ERCD. Maternal death is very rare with either type of delivery.  The risks of an elective repeat cesarean delivery (ERCD) were reviewed with the patient including but not limited to: 11/998 risk of uterine rupture which could have serious consequences, bleeding which may require transfusion; infection which may require antibiotics; injury to bowel, bladder or other surrounding organs (bowel, bladder, ureters); injury to the fetus; need for additional procedures including hysterectomy in the event of a life-threatening hemorrhage; thromboembolic phenomenon; abnormal placentation; incisional problems; death and other postoperative or anesthesia complications.    Desires VBAC Prior NSVD, good candidate      Short interval between pregnancies affecting pregnancy in first trimester, antepartum 03/01/2021 by Zipporah Plants, CNM No       Preterm labor symptoms and general obstetric precautions including but not limited to vaginal bleeding, contractions, leaking of fluid and fetal movement were reviewed in detail with the patient.   Return in about 2 weeks  (around 09/22/2021) for rob x 2 visits.  Tresea Mall, CNM 09/08/2021 10:11 AM

## 2021-09-09 ENCOUNTER — Encounter: Payer: Medicaid Other | Admitting: Advanced Practice Midwife

## 2021-09-22 ENCOUNTER — Encounter: Payer: Medicaid Other | Admitting: Obstetrics

## 2021-10-07 ENCOUNTER — Encounter: Payer: Medicaid Other | Admitting: Obstetrics and Gynecology

## 2021-10-13 ENCOUNTER — Encounter: Payer: Medicaid Other | Admitting: Advanced Practice Midwife

## 2022-07-10 ENCOUNTER — Other Ambulatory Visit: Payer: Self-pay

## 2022-07-10 DIAGNOSIS — S060X0A Concussion without loss of consciousness, initial encounter: Secondary | ICD-10-CM | POA: Diagnosis not present

## 2022-07-10 DIAGNOSIS — Y92410 Unspecified street and highway as the place of occurrence of the external cause: Secondary | ICD-10-CM | POA: Insufficient documentation

## 2022-07-10 DIAGNOSIS — M546 Pain in thoracic spine: Secondary | ICD-10-CM | POA: Insufficient documentation

## 2022-07-10 DIAGNOSIS — S0990XA Unspecified injury of head, initial encounter: Secondary | ICD-10-CM | POA: Diagnosis present

## 2022-07-10 LAB — URINALYSIS, ROUTINE W REFLEX MICROSCOPIC
Bacteria, UA: NONE SEEN
Bilirubin Urine: NEGATIVE
Glucose, UA: NEGATIVE mg/dL
Hgb urine dipstick: NEGATIVE
Ketones, ur: 5 mg/dL — AB
Nitrite: NEGATIVE
Protein, ur: NEGATIVE mg/dL
Specific Gravity, Urine: 1.018 (ref 1.005–1.030)
pH: 5 (ref 5.0–8.0)

## 2022-07-10 LAB — COMPREHENSIVE METABOLIC PANEL
ALT: 47 U/L — ABNORMAL HIGH (ref 0–44)
AST: 43 U/L — ABNORMAL HIGH (ref 15–41)
Albumin: 3.6 g/dL (ref 3.5–5.0)
Alkaline Phosphatase: 82 U/L (ref 38–126)
Anion gap: 8 (ref 5–15)
BUN: 8 mg/dL (ref 6–20)
CO2: 24 mmol/L (ref 22–32)
Calcium: 8.4 mg/dL — ABNORMAL LOW (ref 8.9–10.3)
Chloride: 106 mmol/L (ref 98–111)
Creatinine, Ser: 0.72 mg/dL (ref 0.44–1.00)
GFR, Estimated: >60 mL/min (ref >60)
Glucose, Bld: 87 mg/dL (ref 70–99)
Potassium: 3.4 mmol/L — ABNORMAL LOW (ref 3.5–5.1)
Sodium: 138 mmol/L (ref 135–145)
Total Bilirubin: 0.6 mg/dL (ref 0.3–1.2)
Total Protein: 6.6 g/dL (ref 6.5–8.1)

## 2022-07-10 LAB — CBC
HCT: 35.3 % — ABNORMAL LOW (ref 36.0–46.0)
Hemoglobin: 11.5 g/dL — ABNORMAL LOW (ref 12.0–15.0)
MCH: 27.1 pg (ref 26.0–34.0)
MCHC: 32.6 g/dL (ref 30.0–36.0)
MCV: 83.1 fL (ref 80.0–100.0)
Platelets: 251 10*3/uL (ref 150–400)
RBC: 4.25 MIL/uL (ref 3.87–5.11)
RDW: 13.5 % (ref 11.5–15.5)
WBC: 7.2 10*3/uL (ref 4.0–10.5)
nRBC: 0 % (ref 0.0–0.2)

## 2022-07-10 LAB — PREGNANCY, URINE: Preg Test, Ur: NEGATIVE

## 2022-07-10 LAB — LIPASE, BLOOD: Lipase: 20 U/L (ref 11–51)

## 2022-07-10 NOTE — ED Triage Notes (Signed)
Patient reports she was in [facility] 07/01/22, states she was evaluated at [facility] yesterday for persistent headaches and was informed head CT was negative. Reports severe headaches, nausea, vomiting and lower back pain. AOX4. Resp even, unlabored on RA. Reports intermittent cold sweats, denies fevers. States she was concerned when home thermometer read 94 degrees.
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
## 2022-07-10 NOTE — ED Notes (Signed)
Pt receiving door dash delivery in lobby

## 2022-07-11 ENCOUNTER — Emergency Department
Admission: EM | Admit: 2022-07-11 | Discharge: 2022-07-11 | Disposition: A | Discharge status: Home / Self Care | Payer: Medicaid Other | Attending: Emergency Medicine | Admitting: Emergency Medicine

## 2022-07-11 DIAGNOSIS — M545 Low back pain, unspecified: Secondary | ICD-10-CM

## 2022-07-11 DIAGNOSIS — S060X0A Concussion without loss of consciousness, initial encounter: Secondary | ICD-10-CM

## 2022-07-11 MED ORDER — KETOROLAC TROMETHAMINE 15 MG/ML IJ SOLN
15.0000 mg | Freq: Once | INTRAMUSCULAR | Status: AC
  Administered 2022-07-11: 15 mg via INTRAMUSCULAR
  Filled 2022-07-11: qty 1

## 2022-07-11 NOTE — Discharge Instructions (Addendum)
You may have a concussion.  Please try to avoid screens and rest until your symptoms are improving.  You can take Tylenol and Motrin for headache and back pain.  Please see your primary care provider in about 1 week for repeat evaluation to ensure your symptoms are improving.

## 2022-07-11 NOTE — ED Notes (Signed)
Pt A&OX4 ambulatory at d/c with independent steady gait but wheeled out of ED via wheelchair. Pt verbalized understanding of d/c instructions and follow up care.

## 2022-07-11 NOTE — ED Notes (Signed)
Dr McHugh at bedside 
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
